# Patient Record
Sex: Female | Born: 1995 | Race: Black or African American | Hispanic: No | Marital: Single | Smoking: Former smoker
Health system: Southern US, Community
[De-identification: ages and names within clinical notes are randomized; demographics above are authoritative.]

## PROBLEM LIST (undated history)

## (undated) ENCOUNTER — Emergency Department (HOSPITAL_COMMUNITY): Admission: EM | Payer: BC Managed Care – PPO

## (undated) ENCOUNTER — Inpatient Hospital Stay (HOSPITAL_COMMUNITY): Payer: Self-pay

## (undated) DIAGNOSIS — R4020 Unspecified coma: Secondary | ICD-10-CM

## (undated) DIAGNOSIS — F329 Major depressive disorder, single episode, unspecified: Secondary | ICD-10-CM

## (undated) DIAGNOSIS — F32A Depression, unspecified: Secondary | ICD-10-CM

## (undated) DIAGNOSIS — G43909 Migraine, unspecified, not intractable, without status migrainosus: Secondary | ICD-10-CM

## (undated) DIAGNOSIS — H539 Unspecified visual disturbance: Secondary | ICD-10-CM

## (undated) DIAGNOSIS — A6000 Herpesviral infection of urogenital system, unspecified: Secondary | ICD-10-CM

## (undated) DIAGNOSIS — F419 Anxiety disorder, unspecified: Secondary | ICD-10-CM

## (undated) HISTORY — DX: Migraine, unspecified, not intractable, without status migrainosus: G43.909

## (undated) HISTORY — DX: Unspecified coma: R40.20

## (undated) HISTORY — PX: NO PAST SURGERIES: SHX2092

## (undated) HISTORY — PX: WISDOM TOOTH EXTRACTION: SHX21

## (undated) HISTORY — DX: Major depressive disorder, single episode, unspecified: F32.9

## (undated) HISTORY — DX: Depression, unspecified: F32.A

---
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
## 2016-02-15 ENCOUNTER — Encounter: Payer: Self-pay | Admitting: Nurse Practitioner

## 2016-02-15 ENCOUNTER — Ambulatory Visit (INDEPENDENT_AMBULATORY_CARE_PROVIDER_SITE_OTHER): Payer: BC Managed Care – PPO | Admitting: Nurse Practitioner

## 2016-02-15 VITALS — BP 106/68 | HR 80 | Ht 67.0 in | Wt 136.6 lb

## 2016-02-15 DIAGNOSIS — R55 Syncope and collapse: Secondary | ICD-10-CM | POA: Diagnosis not present

## 2016-02-15 DIAGNOSIS — G43709 Chronic migraine without aura, not intractable, without status migrainosus: Secondary | ICD-10-CM | POA: Diagnosis not present

## 2016-02-15 DIAGNOSIS — IMO0002 Reserved for concepts with insufficient information to code with codable children: Secondary | ICD-10-CM

## 2016-02-15 NOTE — Patient Instructions (Addendum)
EEG was normal She has stopped taking Topamax, difficulty to complying with the medications, Keep Maxalt as needed for headache Document any events May resume driving Follow up in 6 months
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
## 2016-08-18 ENCOUNTER — Ambulatory Visit: Payer: BC Managed Care – PPO | Admitting: Nurse Practitioner

## 2017-12-08 ENCOUNTER — Encounter (HOSPITAL_COMMUNITY): Payer: Self-pay | Admitting: Emergency Medicine

## 2017-12-08 ENCOUNTER — Emergency Department (HOSPITAL_COMMUNITY)
Admission: EM | Admit: 2017-12-08 | Discharge: 2017-12-08 | Disposition: A | Discharge status: Home / Self Care | Payer: BC Managed Care – PPO | Attending: Emergency Medicine | Admitting: Emergency Medicine

## 2017-12-08 DIAGNOSIS — S161XXA Strain of muscle, fascia and tendon at neck level, initial encounter: Secondary | ICD-10-CM

## 2017-12-08 DIAGNOSIS — M549 Dorsalgia, unspecified: Secondary | ICD-10-CM | POA: Insufficient documentation

## 2017-12-08 DIAGNOSIS — Z79899 Other long term (current) drug therapy: Secondary | ICD-10-CM | POA: Insufficient documentation

## 2017-12-08 DIAGNOSIS — Y999 Unspecified external cause status: Secondary | ICD-10-CM | POA: Insufficient documentation

## 2017-12-08 DIAGNOSIS — Y9389 Activity, other specified: Secondary | ICD-10-CM | POA: Diagnosis not present

## 2017-12-08 DIAGNOSIS — Y92410 Unspecified street and highway as the place of occurrence of the external cause: Secondary | ICD-10-CM | POA: Insufficient documentation

## 2017-12-08 DIAGNOSIS — M25511 Pain in right shoulder: Secondary | ICD-10-CM | POA: Diagnosis not present

## 2017-12-08 DIAGNOSIS — S199XXA Unspecified injury of neck, initial encounter: Secondary | ICD-10-CM | POA: Diagnosis present

## 2017-12-08 MED ORDER — METHOCARBAMOL 500 MG PO TABS: 500.0000 mg | ORAL_TABLET | Freq: Two times a day (BID) | ORAL | 0 refills | Status: DC

## 2017-12-08 MED ORDER — NAPROXEN 500 MG PO TABS: 500.0000 mg | ORAL_TABLET | Freq: Two times a day (BID) | ORAL | 0 refills | Status: DC
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
## 2017-12-08 NOTE — Discharge Instructions (Signed)
Take naproxen 2 times a day with meals.  Do not take other anti-inflammatories at the same time open (Advil, Motrin, ibuprofen, Aleve). You may supplement with Tylenol if you need further pain control. Use Flexeril as needed for muscle stiffness or soreness.  Have caution, this may make you tired or groggy.  Do not drive or operate heavy machinery while taking this medicine. Use ice packs or heating pads if this helps control your pain. You will likely have continued muscle stiffness and soreness over the next couple days.  Follow-up with primary care in 1 week if your symptoms are not improving. Return to the emergency room if you develop vision changes, vomiting, slurred speech, numbness, loss of bowel or bladder control, or any new or worsening symptoms.
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
## 2018-06-30 ENCOUNTER — Other Ambulatory Visit: Payer: Self-pay | Admitting: Family Medicine

## 2018-06-30 ENCOUNTER — Other Ambulatory Visit (HOSPITAL_COMMUNITY)
Admission: RE | Admit: 2018-06-30 | Discharge: 2018-06-30 | Disposition: A | Discharge status: Home / Self Care | Payer: BC Managed Care – PPO | Source: Ambulatory Visit | Attending: Family Medicine | Admitting: Family Medicine

## 2018-06-30 DIAGNOSIS — Z124 Encounter for screening for malignant neoplasm of cervix: Secondary | ICD-10-CM | POA: Insufficient documentation

## 2018-07-02 LAB — CYTOLOGY - PAP
CHLAMYDIA, DNA PROBE: NEGATIVE
Diagnosis: NEGATIVE
Neisseria Gonorrhea: NEGATIVE

## 2019-07-29 ENCOUNTER — Emergency Department (HOSPITAL_COMMUNITY)
Admission: EM | Admit: 2019-07-29 | Discharge: 2019-07-29 | Disposition: A | Discharge status: Home / Self Care | Payer: BC Managed Care – PPO | Attending: Emergency Medicine | Admitting: Emergency Medicine

## 2019-07-29 ENCOUNTER — Emergency Department (HOSPITAL_COMMUNITY): Payer: BC Managed Care – PPO

## 2019-07-29 ENCOUNTER — Other Ambulatory Visit: Payer: Self-pay

## 2019-07-29 DIAGNOSIS — S0012XA Contusion of left eyelid and periocular area, initial encounter: Secondary | ICD-10-CM | POA: Insufficient documentation

## 2019-07-29 DIAGNOSIS — Y93K1 Activity, walking an animal: Secondary | ICD-10-CM | POA: Diagnosis not present

## 2019-07-29 DIAGNOSIS — Z79899 Other long term (current) drug therapy: Secondary | ICD-10-CM | POA: Diagnosis not present

## 2019-07-29 DIAGNOSIS — S0181XA Laceration without foreign body of other part of head, initial encounter: Secondary | ICD-10-CM | POA: Diagnosis not present

## 2019-07-29 DIAGNOSIS — Y998 Other external cause status: Secondary | ICD-10-CM | POA: Diagnosis not present

## 2019-07-29 DIAGNOSIS — Y929 Unspecified place or not applicable: Secondary | ICD-10-CM | POA: Diagnosis not present

## 2019-07-29 DIAGNOSIS — S0990XA Unspecified injury of head, initial encounter: Secondary | ICD-10-CM | POA: Diagnosis present

## 2019-07-29 DIAGNOSIS — H05232 Hemorrhage of left orbit: Secondary | ICD-10-CM

## 2019-07-29 MED ORDER — OXYCODONE-ACETAMINOPHEN 5-325 MG PO TABS
1.0000 | ORAL_TABLET | Freq: Once | ORAL | Status: AC
  Administered 2019-07-29: 1 via ORAL
  Filled 2019-07-29: qty 1

## 2019-07-29 MED ORDER — LIDOCAINE-EPINEPHRINE (PF) 2 %-1:200000 IJ SOLN
10.0000 mL | Freq: Once | INTRAMUSCULAR | Status: AC
  Administered 2019-07-29: 10 mL
  Filled 2019-07-29: qty 20
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
## 2019-07-29 NOTE — ED Notes (Signed)
Patient transported to CT 

## 2019-07-29 NOTE — Discharge Instructions (Addendum)
Thank you for allowing me to care for you today in the Emergency Department.   Take 650 mg of Tylenol or 600 mg of ibuprofen with food every 6 hours for pain.  You can alternate between these 2 medications every 3 hours if your pain returns.  For instance, you can take Tylenol at noon, followed by a dose of ibuprofen at 3, followed by second dose of Tylenol and 6.  Apply ice pack for 15 to 20 minutes at least 4-5 times per day for the next 5 days.  This will help with pain and swelling of your left eye.  Your stitches should fall out on their own within the next week.  Keep the area clean and dry for the first 24 hours.  After that time, you can gently wash the area with warm water and soap.  You do not need to keep it covered unless you are concerned about the appearance.  You can apply a topical antibiotic ointment such as bacitracin or Neosporin.  If you feel unsafe at home, you can return to the ER for further evaluation.  You should also return if you develop severe pain to your left eye, visual changes in your left eye, if you pass out, develop persistent vomiting, or if your wound starts to get red, hot to the touch, swollen, or if you develop fever or chills.
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
## 2020-07-28 ENCOUNTER — Encounter (HOSPITAL_BASED_OUTPATIENT_CLINIC_OR_DEPARTMENT_OTHER): Payer: Self-pay | Admitting: Emergency Medicine

## 2020-07-28 ENCOUNTER — Emergency Department (HOSPITAL_BASED_OUTPATIENT_CLINIC_OR_DEPARTMENT_OTHER)
Admission: EM | Admit: 2020-07-28 | Discharge: 2020-07-28 | Disposition: A | Discharge status: Home / Self Care | Payer: BC Managed Care – PPO | Attending: Emergency Medicine | Admitting: Emergency Medicine

## 2020-07-28 ENCOUNTER — Other Ambulatory Visit: Payer: Self-pay

## 2020-07-28 ENCOUNTER — Emergency Department (HOSPITAL_BASED_OUTPATIENT_CLINIC_OR_DEPARTMENT_OTHER): Payer: BC Managed Care – PPO

## 2020-07-28 DIAGNOSIS — Z23 Encounter for immunization: Secondary | ICD-10-CM | POA: Diagnosis not present

## 2020-07-28 DIAGNOSIS — S61214A Laceration without foreign body of right ring finger without damage to nail, initial encounter: Secondary | ICD-10-CM | POA: Insufficient documentation

## 2020-07-28 DIAGNOSIS — W268XXA Contact with other sharp object(s), not elsewhere classified, initial encounter: Secondary | ICD-10-CM | POA: Diagnosis not present

## 2020-07-28 DIAGNOSIS — S61219A Laceration without foreign body of unspecified finger without damage to nail, initial encounter: Secondary | ICD-10-CM

## 2020-07-28 DIAGNOSIS — Y92512 Supermarket, store or market as the place of occurrence of the external cause: Secondary | ICD-10-CM | POA: Diagnosis not present

## 2020-07-28 DIAGNOSIS — S60949A Unspecified superficial injury of unspecified finger, initial encounter: Secondary | ICD-10-CM | POA: Diagnosis present

## 2020-07-28 MED ORDER — TETANUS-DIPHTH-ACELL PERTUSSIS 5-2.5-18.5 LF-MCG/0.5 IM SUSY
0.5000 mL | PREFILLED_SYRINGE | Freq: Once | INTRAMUSCULAR | Status: AC
  Administered 2020-07-28: 0.5 mL via INTRAMUSCULAR
  Filled 2020-07-28: qty 0.5

## 2020-07-28 MED ORDER — ACETAMINOPHEN 325 MG PO TABS
650.0000 mg | ORAL_TABLET | Freq: Once | ORAL | Status: AC
  Administered 2020-07-28: 650 mg via ORAL
  Filled 2020-07-28: qty 2

## 2020-07-28 MED ORDER — CEPHALEXIN 500 MG PO CAPS: 500.0000 mg | ORAL_CAPSULE | Freq: Four times a day (QID) | ORAL | 0 refills | Status: AC

## 2020-07-28 MED ORDER — LIDOCAINE HCL 2 % IJ SOLN
20.0000 mL | Freq: Once | INTRAMUSCULAR | Status: AC
  Administered 2020-07-28: 400 mg via INTRADERMAL
  Filled 2020-07-28: qty 20

## 2020-07-28 NOTE — ED Triage Notes (Signed)
She cut her R fingers on a shelf at the store. The ring finger has the most severe laceration. Bandage applied at triage.

## 2020-07-28 NOTE — Discharge Instructions (Addendum)
You were given a prescription for antibiotics. Please take the antibiotic prescription fully.   The sutures will need to be removed in 7-10 days.   Follow up with Dr. Merrilee Seashore office for reassessment of the sensation and strength in your finger.   Please return to the emergency room immediately if you experience any new or worsening symptoms or any symptoms that indicate worsening infection such as fevers, increased redness/swelling/pain, warmth, or drainage from the affected area.

## 2020-07-28 NOTE — ED Provider Notes (Signed)
[facility] EMERGENCY DEPARTMENT Provider Note   CSN: [id number] Arrival date & time: 07/28/20  1656     History Chief Complaint  Patient presents with  . Hand Injury    Elizabeth Becker is a 24 y.o. female.  HPI   24 y/o female with a h/o anxiety/depression, LOC, migraine, vision abnormalities, presents to the ED today for evaluation of lacerations to the fingers that occurred after cutting her fingers on metal prior to arrival.  Past Medical History:  Diagnosis Date  . Anxiety   . Depression   . Loss of consciousness (HCC)   . Migraine   . Vision abnormalities    wears glasses for distance    Patient Active Problem List   Diagnosis Date Noted  . Chronic migraine 07/10/2015  . Passed out 07/10/2015  . Conduct disorder, adolescent-onset type 10/07/2013  . 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 NO PAST SURGERIES       OB History   No obstetric history on file.     Family History  Problem Relation Age of Onset  . Healthy Mother   . Healthy Father     Social History   Tobacco Use  . Smoking status: Never Smoker  . Smokeless tobacco: Never Used  Substance Use Topics  . Alcohol use: No    Alcohol/week: 0.0 standard drinks  . Drug use: Yes    Comment: tried marijuana last use 1 year ago    Home Medications Prior to Admission medications   Medication Sig Start Date End Date Taking? Authorizing Provider  cephALEXin (KEFLEX) 500 MG capsule Take 1 capsule (500 mg total) by mouth 4 (four) times daily for 7 days. 07/28/20 08/04/20  Manveer Gomes S, PA-C  escitalopram (LEXAPRO) 20 MG tablet Take 1 tablet (20 mg total) by mouth daily after breakfast. 10/14/13   Winson, Louie Bun, NP  ibuprofen (ADVIL,MOTRIN) 400 MG tablet Take 1 tablet (400 mg total) by mouth every 6 (six) hours as needed for headache, mild pain or cramping. Patient may resume  home supply. 10/14/13   Winson, Louie Bun, NP  methocarbamol (ROBAXIN) 500 MG tablet Take 1 tablet (500 mg total) by mouth 2 (two) times daily. 12/08/17   Caccavale, Sophia, PA-C  naproxen (NAPROSYN) 500 MG tablet Take 1 tablet (500 mg total) by mouth 2 (two) times daily with a meal. 12/08/17   Caccavale, Sophia, PA-C  norethindrone-ethinyl estradiol (TRIPHASIL,CYCLAFEM,ALYACEN) 0.5/0.75/1-35 MG-MCG tablet Take 1 tablet by mouth daily. Patient/family can consider resumption of birth control with onset of next menses or as directed by outpatient prescribing provider.  If refill is needed, patient/family may contact outpatient prescribing provider for refill. 10/14/13   Winson, Louie Bun, NP  rizatriptan (MAXALT-MLT) 5 MG disintegrating tablet Take 1 tablet (5 mg total) by mouth as needed. May repeat in 2 hours if needed 07/10/15   Levert Feinstein, MD    Allergies    Patient has no known allergies.  Review of Systems   Review of Systems  Constitutional: Negative for fever.  Musculoskeletal:       Finger pain  Skin: Positive for wound.  Neurological: Positive for numbness. Negative for weakness.    Physical Exam Updated Vital Signs BP 101/72 (BP Location: Right Arm)   Pulse 73   Temp 97.6 F (36.4 C) (Oral)   Resp 16   Ht 5\' 7"  (1.702 m)   Wt  55.3 kg   LMP 06/25/2020   SpO2 100%   BMI 19.11 kg/m   Physical Exam Vitals and nursing note reviewed.  Constitutional:      General: She is not in acute distress.    Appearance: She is well-developed and well-nourished.  HENT:     Head: Normocephalic and atraumatic.  Eyes:     Conjunctiva/sclera: Conjunctivae normal.  Cardiovascular:     Rate and Rhythm: Normal rate.  Pulmonary:     Effort: Pulmonary effort is normal.  Musculoskeletal:        General: Normal range of motion.     Cervical back: Neck supple.     Comments: 3cm cervued lac to the 4th digit on the right hand over the middle phalanx. Brisk cap refill noted distally. Decreased sensation  noted distal to the wound. Strength appears intact with flexion/extension at the DIP, PIP and MCP joint. More superficial abrasion noted the right middle finger that is not actively bleeding. Strength, sensation, rom and perfusion intact to this finger.   Skin:    General: Skin is warm and dry.  Neurological:     Mental Status: She is alert.     Sensory: Sensory deficit:    Psychiatric:        Mood and Affect: Mood and affect normal.     ED Results / Procedures / Treatments   Labs (all labs ordered are listed, but only abnormal results are displayed) Labs Reviewed - No data to display  EKG None  Radiology DG Hand Complete Right  Result Date: 07/28/2020 CLINICAL DATA:  Laceration EXAM: RIGHT HAND - COMPLETE 3+ VIEW COMPARISON:  None. FINDINGS: Frontal, oblique, lateral views of the right hand are obtained. There is significant soft tissue swelling volar aspect fourth digit at the level of the middle phalanx. No radiopaque foreign body. No acute fracture, subluxation, or dislocation. Joint spaces are well preserved. IMPRESSION: 1. Soft tissue swelling fourth digit. No fracture or radiopaque foreign body. Electronically Signed   By: Sharlet Salina M.D.   On: 07/28/2020 19:32    Procedures .Marland KitchenLaceration Repair  Date/Time: 07/28/2020 9:18 PM Performed by: Karrie Meres, PA-C Authorized by: Karrie Meres, PA-C   Consent:    Consent obtained:  Verbal   Consent given by:  Patient   Risks, benefits, and alternatives were discussed: yes     Risks discussed:  Infection, pain, need for additional repair and poor cosmetic result   Alternatives discussed:  No treatment Universal protocol:    Procedure explained and questions answered to patient or proxy's satisfaction: yes     Imaging studies available: yes     Site/side marked: yes     Patient identity confirmed:  Verbally with patient Anesthesia:    Anesthesia method:  Nerve block   Block needle gauge:  25 G   Block  anesthetic:  Lidocaine 2% w/o epi   Block injection procedure:  Anatomic landmarks identified, introduced needle, incremental injection, negative aspiration for blood and anatomic landmarks palpated   Block outcome:  Anesthesia achieved Laceration details:    Location:  Finger   Finger location:  R ring finger   Length (cm):  3 Pre-procedure details:    Preparation:  Patient was prepped and draped in usual sterile fashion and imaging obtained to evaluate for foreign bodies Exploration:    Limited defect created (wound extended): no     Hemostasis achieved with:  Direct pressure and tourniquet   Wound exploration: wound explored through full range  of motion and entire depth of wound visualized     Contaminated: no   Treatment:    Area cleansed with:  Saline   Amount of cleaning:  Extensive   Irrigation solution:  Sterile saline   Irrigation volume:  1L   Irrigation method:  Pressure wash   Visualized foreign bodies/material removed: no     Debridement:  None   Undermining:  None   Scar revision: no   Skin repair:    Repair method:  Sutures   Suture size:  5-0   Suture material:  Prolene   Suture technique:  Simple interrupted   Number of sutures:  9 Approximation:    Approximation:  Close Repair type:    Repair type:  Simple Post-procedure details:    Dressing:  Splint for protection and non-adherent dressing   Procedure completion:  Tolerated   (including critical care time)  Medications Ordered in ED Medications  Tdap (BOOSTRIX) injection 0.5 mL (0.5 mLs Intramuscular Given 07/28/20 1922)  lidocaine (XYLOCAINE) 2 % (with pres) injection 400 mg (400 mg Intradermal Given by Other 07/28/20 1925)  acetaminophen (TYLENOL) tablet 650 mg (650 mg Oral Given 07/28/20 1921)    ED Course  I have reviewed the triage vital signs and the nursing notes.  Pertinent labs & imaging results that were available during my care of the patient were reviewed by me and considered in my  medical decision making (see chart for details).    MDM Rules/Calculators/A&P                          Pressure irrigation performed. Wound explored and base of wound visualized in a bloodless field without evidence of foreign body.  Laceration occurred < 8 hours prior to repair which was well tolerated.  Tdap updated.  Pt has  no comorbidities to effect normal wound healing. Pt discharged with antibiotics. No obvious flexor tendon involvement on exam but does have some decreased sensation to the finger. Will have pt f/u with hand surgery. Discussed suture home care with patient and answered questions. Pt to follow-up for wound check and suture removal in 7-10 days; they are to return to the ED sooner for signs of infection. Pt is hemodynamically stable with no complaints prior to dc.    Final Clinical Impression(s) / ED Diagnoses Final diagnoses:  Laceration of finger of right hand without foreign body without damage to nail, unspecified finger, initial encounter    Rx / DC Orders ED Discharge Orders         Ordered    cephALEXin (KEFLEX) 500 MG capsule  4 times daily        07/28/20 2117           Rayne Du 07/28/20 2119    Rolan Bucco, MD 07/28/20 2311

## 2020-07-28 NOTE — ED Notes (Signed)
Pt to Xray.

## 2020-08-01 ENCOUNTER — Other Ambulatory Visit: Payer: Self-pay | Admitting: Orthopedic Surgery

## 2020-08-02 ENCOUNTER — Other Ambulatory Visit: Payer: Self-pay

## 2020-08-02 ENCOUNTER — Encounter (HOSPITAL_BASED_OUTPATIENT_CLINIC_OR_DEPARTMENT_OTHER): Payer: Self-pay | Admitting: Orthopedic Surgery

## 2020-08-06 ENCOUNTER — Other Ambulatory Visit (HOSPITAL_COMMUNITY)
Admission: RE | Admit: 2020-08-06 | Discharge: 2020-08-06 | Disposition: A | Discharge status: Home / Self Care | Payer: BC Managed Care – PPO | Source: Ambulatory Visit | Attending: Orthopedic Surgery | Admitting: Orthopedic Surgery

## 2020-08-06 DIAGNOSIS — Z79899 Other long term (current) drug therapy: Secondary | ICD-10-CM | POA: Diagnosis not present

## 2020-08-06 DIAGNOSIS — Z01812 Encounter for preprocedural laboratory examination: Secondary | ICD-10-CM | POA: Insufficient documentation

## 2020-08-06 DIAGNOSIS — X58XXXA Exposure to other specified factors, initial encounter: Secondary | ICD-10-CM | POA: Diagnosis not present

## 2020-08-06 DIAGNOSIS — S61214A Laceration without foreign body of right ring finger without damage to nail, initial encounter: Secondary | ICD-10-CM | POA: Diagnosis present

## 2020-08-06 DIAGNOSIS — Z20822 Contact with and (suspected) exposure to covid-19: Secondary | ICD-10-CM | POA: Insufficient documentation

## 2020-08-06 LAB — SARS CORONAVIRUS 2 (TAT 6-24 HRS): SARS Coronavirus 2: NEGATIVE

## 2020-08-06 NOTE — Anesthesia Preprocedure Evaluation (Addendum)
Anesthesia Evaluation  Patient identified by MRN, date of birth, ID band Patient awake    Reviewed: Allergy & Precautions, NPO status , Patient's Chart, lab work & pertinent test results  Airway Mallampati: II  TM Distance: >3 FB Neck ROM: Full    Dental no notable dental hx. (+) Teeth Intact, Dental Advisory Given   Pulmonary neg pulmonary ROS,    Pulmonary exam normal breath sounds clear to auscultation       Cardiovascular Exercise Tolerance: Good negative cardio ROS Normal cardiovascular exam Rhythm:Regular Rate:Normal     Neuro/Psych  Headaches, Anxiety    GI/Hepatic negative GI ROS, Neg liver ROS,   Endo/Other    Renal/GU negative Renal ROS     Musculoskeletal   Abdominal   Peds  Hematology negative hematology ROS (+)   Anesthesia Other Findings   Reproductive/Obstetrics                            Anesthesia Physical Anesthesia Plan  ASA: I  Anesthesia Plan: General   Post-op Pain Management:    Induction:   PONV Risk Score and Plan: Treatment may vary due to age or medical condition, Midazolam and Ondansetron  Airway Management Planned: LMA  Additional Equipment: None  Intra-op Plan:   Post-operative Plan:   Informed Consent: I have reviewed the patients History and Physical, chart, labs and discussed the procedure including the risks, benefits and alternatives for the proposed anesthesia with the patient or authorized representative who has indicated his/her understanding and acceptance.     Dental advisory given  Plan Discussed with: CRNA and Anesthesiologist  Anesthesia Plan Comments:       Anesthesia Quick Evaluation

## 2020-08-07 ENCOUNTER — Ambulatory Visit (HOSPITAL_BASED_OUTPATIENT_CLINIC_OR_DEPARTMENT_OTHER): Payer: BC Managed Care – PPO | Admitting: Anesthesiology

## 2020-08-07 ENCOUNTER — Encounter (HOSPITAL_BASED_OUTPATIENT_CLINIC_OR_DEPARTMENT_OTHER): Payer: Self-pay | Admitting: Orthopedic Surgery

## 2020-08-07 ENCOUNTER — Encounter (HOSPITAL_BASED_OUTPATIENT_CLINIC_OR_DEPARTMENT_OTHER)
Admission: RE | Disposition: A | Discharge status: Home / Self Care | Payer: Self-pay | Source: Home / Self Care | Attending: Orthopedic Surgery

## 2020-08-07 ENCOUNTER — Ambulatory Visit (HOSPITAL_BASED_OUTPATIENT_CLINIC_OR_DEPARTMENT_OTHER)
Admission: RE | Admit: 2020-08-07 | Discharge: 2020-08-07 | Disposition: A | Discharge status: Home / Self Care | Payer: BC Managed Care – PPO | Attending: Orthopedic Surgery | Admitting: Orthopedic Surgery

## 2020-08-07 ENCOUNTER — Other Ambulatory Visit: Payer: Self-pay

## 2020-08-07 DIAGNOSIS — S61214A Laceration without foreign body of right ring finger without damage to nail, initial encounter: Secondary | ICD-10-CM | POA: Diagnosis not present

## 2020-08-07 DIAGNOSIS — X58XXXA Exposure to other specified factors, initial encounter: Secondary | ICD-10-CM | POA: Insufficient documentation

## 2020-08-07 DIAGNOSIS — Z20822 Contact with and (suspected) exposure to covid-19: Secondary | ICD-10-CM | POA: Insufficient documentation

## 2020-08-07 DIAGNOSIS — Z79899 Other long term (current) drug therapy: Secondary | ICD-10-CM | POA: Insufficient documentation

## 2020-08-07 HISTORY — PX: WOUND EXPLORATION: SHX6188

## 2020-08-07 LAB — POCT PREGNANCY, URINE: Preg Test, Ur: NEGATIVE

## 2020-08-07 SURGERY — WOUND EXPLORATION
Anesthesia: Regional | Site: Finger | Laterality: Right

## 2020-08-07 MED ORDER — ONDANSETRON HCL 4 MG/2ML IJ SOLN
INTRAMUSCULAR | Status: DC | PRN
  Administered 2020-08-07: 4 mg via INTRAVENOUS

## 2020-08-07 MED ORDER — OXYCODONE HCL 5 MG PO TABS
ORAL_TABLET | ORAL | Status: AC
  Filled 2020-08-07: qty 1

## 2020-08-07 MED ORDER — ACETAMINOPHEN 160 MG/5ML PO SOLN
325.0000 mg | ORAL | Status: DC | PRN
Start: 2020-08-07 — End: 2020-08-07

## 2020-08-07 MED ORDER — LIDOCAINE HCL (CARDIAC) PF 100 MG/5ML IV SOSY
PREFILLED_SYRINGE | INTRAVENOUS | Status: DC | PRN
  Administered 2020-08-07: 100 mg via INTRAVENOUS

## 2020-08-07 MED ORDER — FENTANYL CITRATE (PF) 100 MCG/2ML IJ SOLN
INTRAMUSCULAR | Status: DC | PRN
  Administered 2020-08-07: 50 ug via INTRAVENOUS
  Administered 2020-08-07 (×2): 25 ug via INTRAVENOUS

## 2020-08-07 MED ORDER — MIDAZOLAM HCL 5 MG/5ML IJ SOLN
INTRAMUSCULAR | Status: DC | PRN
  Administered 2020-08-07: 2 mg via INTRAVENOUS

## 2020-08-07 MED ORDER — FENTANYL CITRATE (PF) 100 MCG/2ML IJ SOLN
INTRAMUSCULAR | Status: AC
  Filled 2020-08-07: qty 2

## 2020-08-07 MED ORDER — ACETAMINOPHEN 325 MG PO TABS: 325.0000 mg | ORAL_TABLET | ORAL | Status: DC | PRN

## 2020-08-07 MED ORDER — OXYCODONE HCL 5 MG/5ML PO SOLN: 5.0000 mg | Freq: Once | ORAL | Status: AC | PRN

## 2020-08-07 MED ORDER — HYDROCODONE-ACETAMINOPHEN 5-325 MG PO TABS: ORAL_TABLET | ORAL | 0 refills | Status: DC

## 2020-08-07 MED ORDER — DEXAMETHASONE SODIUM PHOSPHATE 10 MG/ML IJ SOLN
INTRAMUSCULAR | Status: AC
  Filled 2020-08-07: qty 1

## 2020-08-07 MED ORDER — LACTATED RINGERS IV SOLN: INTRAVENOUS | Status: DC

## 2020-08-07 MED ORDER — MIDAZOLAM HCL 2 MG/2ML IJ SOLN
INTRAMUSCULAR | Status: AC
  Filled 2020-08-07: qty 2

## 2020-08-07 MED ORDER — DEXAMETHASONE SODIUM PHOSPHATE 10 MG/ML IJ SOLN
INTRAMUSCULAR | Status: DC | PRN
  Administered 2020-08-07: 10 mg via INTRAVENOUS

## 2020-08-07 MED ORDER — CEFAZOLIN SODIUM-DEXTROSE 2-4 GM/100ML-% IV SOLN
2.0000 g | INTRAVENOUS | Status: AC
  Administered 2020-08-07: 14:00:00 2 g via INTRAVENOUS

## 2020-08-07 MED ORDER — LIDOCAINE 2% (20 MG/ML) 5 ML SYRINGE
INTRAMUSCULAR | Status: AC
  Filled 2020-08-07: qty 5

## 2020-08-07 MED ORDER — CEFAZOLIN SODIUM-DEXTROSE 2-4 GM/100ML-% IV SOLN
INTRAVENOUS | Status: AC
  Filled 2020-08-07: qty 100

## 2020-08-07 MED ORDER — BUPIVACAINE HCL (PF) 0.25 % IJ SOLN
INTRAMUSCULAR | Status: DC | PRN
  Administered 2020-08-07: 9 mL

## 2020-08-07 MED ORDER — HEPARIN SODIUM (PORCINE) 1000 UNIT/ML IJ SOLN
INTRAMUSCULAR | Status: AC
  Filled 2020-08-07: qty 1

## 2020-08-07 MED ORDER — ONDANSETRON HCL 4 MG/2ML IJ SOLN: 4.0000 mg | Freq: Once | INTRAMUSCULAR | Status: DC | PRN

## 2020-08-07 MED ORDER — LIDOCAINE HCL (PF) 1 % IJ SOLN
INTRAMUSCULAR | Status: AC
  Filled 2020-08-07: qty 30

## 2020-08-07 MED ORDER — PROPOFOL 10 MG/ML IV BOLUS
INTRAVENOUS | Status: DC | PRN
  Administered 2020-08-07: 150 mg via INTRAVENOUS

## 2020-08-07 MED ORDER — OXYCODONE HCL 5 MG PO TABS
5.0000 mg | ORAL_TABLET | Freq: Once | ORAL | Status: AC | PRN
  Administered 2020-08-07: 5 mg via ORAL

## 2020-08-07 MED ORDER — FENTANYL CITRATE (PF) 100 MCG/2ML IJ SOLN
25.0000 ug | INTRAMUSCULAR | Status: DC | PRN
Start: 2020-08-07 — End: 2020-08-07

## 2020-08-07 SURGICAL SUPPLY — 69 items
APL PRP STRL LF DISP 70% ISPRP (MISCELLANEOUS)
BAG DECANTER FOR FLEXI CONT (MISCELLANEOUS) IMPLANT
BLADE MINI RND TIP GREEN BEAV (BLADE) IMPLANT
BLADE SURG 15 STRL LF DISP TIS (BLADE) ×4 IMPLANT
BLADE SURG 15 STRL SS (BLADE) ×6
BNDG CMPR 9X4 STRL LF SNTH (GAUZE/BANDAGES/DRESSINGS) ×2
BNDG COHESIVE 1X5 TAN STRL LF (GAUZE/BANDAGES/DRESSINGS) ×2 IMPLANT
BNDG ELASTIC 3X5.8 VLCR STR LF (GAUZE/BANDAGES/DRESSINGS) ×1 IMPLANT
BNDG ESMARK 4X9 LF (GAUZE/BANDAGES/DRESSINGS) ×2 IMPLANT
BNDG GAUZE ELAST 4 BULKY (GAUZE/BANDAGES/DRESSINGS) ×1 IMPLANT
BRUSH SCRUB EZ PLAIN DRY (MISCELLANEOUS) ×3 IMPLANT
CATH ROBINSON RED A/P 10FR (CATHETERS) IMPLANT
CHLORAPREP W/TINT 26 (MISCELLANEOUS) ×1 IMPLANT
CORD BIPOLAR FORCEPS 12FT (ELECTRODE) ×3 IMPLANT
COVER BACK TABLE 60X90IN (DRAPES) ×3 IMPLANT
COVER MAYO STAND STRL (DRAPES) ×3 IMPLANT
COVER WAND RF STERILE (DRAPES) IMPLANT
CUFF TOURN SGL QUICK 18X4 (TOURNIQUET CUFF) ×2 IMPLANT
DECANTER SPIKE VIAL GLASS SM (MISCELLANEOUS) ×1 IMPLANT
DRAPE EXTREMITY T 121X128X90 (DISPOSABLE) ×3 IMPLANT
DRAPE SURG 17X23 STRL (DRAPES) ×3 IMPLANT
GAUZE SPONGE 4X4 12PLY STRL (GAUZE/BANDAGES/DRESSINGS) ×3 IMPLANT
GAUZE XEROFORM 1X8 LF (GAUZE/BANDAGES/DRESSINGS) ×3 IMPLANT
GLOVE BIO SURGEON STRL SZ7 (GLOVE) ×2 IMPLANT
GLOVE BIO SURGEON STRL SZ7.5 (GLOVE) ×3 IMPLANT
GLOVE BIOGEL PI IND STRL 7.5 (GLOVE) ×1 IMPLANT
GLOVE BIOGEL PI IND STRL 8.5 (GLOVE) IMPLANT
GLOVE BIOGEL PI INDICATOR 7.5 (GLOVE) ×1
GLOVE BIOGEL PI INDICATOR 8.5 (GLOVE)
GLOVE SRG 8 PF TXTR STRL LF DI (GLOVE) ×2 IMPLANT
GLOVE SURG ORTHO 8.0 STRL STRW (GLOVE) ×2 IMPLANT
GLOVE SURG UNDER POLY LF SZ6.5 (GLOVE) ×2 IMPLANT
GLOVE SURG UNDER POLY LF SZ8 (GLOVE) ×3
GOWN STRL REUS W/ TWL LRG LVL3 (GOWN DISPOSABLE) ×3 IMPLANT
GOWN STRL REUS W/TWL LRG LVL3 (GOWN DISPOSABLE) ×6
GOWN STRL REUS W/TWL XL LVL3 (GOWN DISPOSABLE) ×5 IMPLANT
LOOP VESSEL MAXI BLUE (MISCELLANEOUS) IMPLANT
NDL HYPO 25X1 1.5 SAFETY (NEEDLE) IMPLANT
NDL SAFETY ECLIPSE 18X1.5 (NEEDLE) IMPLANT
NEEDLE HYPO 18GX1.5 SHARP (NEEDLE)
NEEDLE HYPO 25X1 1.5 SAFETY (NEEDLE) ×3 IMPLANT
NS IRRIG 1000ML POUR BTL (IV SOLUTION) ×3 IMPLANT
PACK BASIN DAY SURGERY FS (CUSTOM PROCEDURE TRAY) ×3 IMPLANT
PAD CAST 3X4 CTTN HI CHSV (CAST SUPPLIES) ×1 IMPLANT
PAD CAST 4YDX4 CTTN HI CHSV (CAST SUPPLIES) IMPLANT
PADDING CAST ABS 4INX4YD NS (CAST SUPPLIES)
PADDING CAST ABS COTTON 4X4 ST (CAST SUPPLIES) ×1 IMPLANT
PADDING CAST COTTON 3X4 STRL (CAST SUPPLIES)
PADDING CAST COTTON 4X4 STRL (CAST SUPPLIES)
SLEEVE SCD COMPRESS KNEE MED (MISCELLANEOUS) ×2 IMPLANT
SPEAR EYE SURG WECK-CEL (MISCELLANEOUS) ×3 IMPLANT
SPLINT FINGER 3.25 911903 (SOFTGOODS) ×2 IMPLANT
SPLINT PLASTER CAST XFAST 3X15 (CAST SUPPLIES) IMPLANT
SPLINT PLASTER XTRA FASTSET 3X (CAST SUPPLIES)
STOCKINETTE 4X48 STRL (DRAPES) ×3 IMPLANT
SUT ETHIBOND 3-0 V-5 (SUTURE) IMPLANT
SUT ETHILON 4 0 PS 2 18 (SUTURE) ×3 IMPLANT
SUT FIBERWIRE 4-0 18 TAPR NDL (SUTURE)
SUT NYLON 9 0 VRM6 (SUTURE) IMPLANT
SUT PROLENE 6 0 P 1 18 (SUTURE) IMPLANT
SUT SILK 4 0 PS 2 (SUTURE) IMPLANT
SUT SUPRAMID 4-0 (SUTURE) IMPLANT
SUT VICRYL 4-0 PS2 18IN ABS (SUTURE) IMPLANT
SUTURE FIBERWR 4-0 18 TAPR NDL (SUTURE) IMPLANT
SYR BULB EAR ULCER 3OZ GRN STR (SYRINGE) ×3 IMPLANT
SYR CONTROL 10ML LL (SYRINGE) ×2 IMPLANT
TOWEL GREEN STERILE FF (TOWEL DISPOSABLE) ×6 IMPLANT
TRAY DSU PREP LF (CUSTOM PROCEDURE TRAY) ×2 IMPLANT
UNDERPAD 30X36 HEAVY ABSORB (UNDERPADS AND DIAPERS) ×3 IMPLANT

## 2020-08-07 NOTE — Anesthesia Procedure Notes (Signed)
Procedure Name: LMA Insertion Date/Time: 08/07/2020 2:25 PM Performed by: Lauralyn Primes, CRNA Pre-anesthesia Checklist: Patient identified, Emergency Drugs available, Suction available and Patient being monitored Patient Re-evaluated:Patient Re-evaluated prior to induction Oxygen Delivery Method: Circle system utilized Preoxygenation: Pre-oxygenation with 100% oxygen Induction Type: IV induction Ventilation: Mask ventilation without difficulty LMA: LMA inserted LMA Size: 4.0 Number of attempts: 1 Airway Equipment and Method: Bite block Placement Confirmation: positive ETCO2 Tube secured with: Tape Dental Injury: Teeth and Oropharynx as per pre-operative assessment

## 2020-08-07 NOTE — Discharge Instructions (Signed)

## 2020-08-07 NOTE — Op Note (Signed)
NAME: Elizabeth Becker MEDICAL RECORD NO: [id number] DATE OF BIRTH: [birth year] FACILITY: [facility] LOCATION: [facility] PHYSICIAN: Tami Ribas, MD   OPERATIVE REPORT   DATE OF PROCEDURE: 08/07/20    PREOPERATIVE DIAGNOSIS:   Right ring finger laceration with possible tendon artery nerve laceration   POSTOPERATIVE DIAGNOSIS:   Right ring finger laceration   PROCEDURE:   Exploration right ring finger laceration approximately 3.5 cm   SURGEON:  Betha Loa, M.D.   ASSISTANT: Cindee Salt, MD   ANESTHESIA:  General   INTRAVENOUS FLUIDS:  Per anesthesia flow sheet.   ESTIMATED BLOOD LOSS:  Minimal.   COMPLICATIONS:  None.   SPECIMENS:  none   TOURNIQUET TIME:    Total Tourniquet Time Documented: Upper Arm (Right) - 23 minutes Total: Upper Arm (Right) - 23 minutes    DISPOSITION:  Stable to PACU.   INDICATIONS: 24 year old female states she sustained laceration to the right ring finger 07/28/2020.  She was seen at the emergency department where the wound was cleaned and sutured.  She followed up in the office.  She had weak flexion of the DIP joint and decreased sensation the fingertip.  She wishes to proceed with operative exploration with repair of tendon artery nerve is necessary.  Risks, benefits and alternatives of surgery were discussed including the risks of blood loss, infection, damage to nerves, vessels, tendons, ligaments, bone for surgery, need for additional surgery, complications with wound healing, continued pain, stiffness.  She voiced understanding of these risks and elected to proceed.  OPERATIVE COURSE:  After being identified preoperatively by myself,  the patient and I agreed on the procedure and site of the procedure.  The surgical site was marked.  Surgical consent had been signed. She was given IV antibiotics as preoperative antibiotic prophylaxis. She was transferred to the operating room and placed on the operating table in supine position  with the Right upper extremity on an arm board.  General anesthesia was induced by the anesthesiologist.  Right upper extremity was prepped and draped in normal sterile orthopedic fashion.  A surgical pause was performed between the surgeons, anesthesia, and operating room staff and all were in agreement as to the patient, procedure, and site of procedure.  Tourniquet at the proximal aspect of the extremity was inflated to 250 mmHg after exsanguination of the arm with an Esmarch bandage.  The wound was opened after removal of the sutures.  The flexor sheath was identified and was intact.  There is no blood within the sheath.  The finger is placed to range of motion and no damage to the tendons was noted.  The ulnar digital nerve was identified and was intact.  The radial digital nerve was identified and traced through the extent of the wound and was intact.  The wound did not go deep to the nerves.  The skin edges were sharply debrided of any devitalized skin using the scissors.  At the proximal edge of the flap there was some skin that had necrosis.  This was removed.  The wound was copiously irrigated with sterile saline.  It was then repaired with 4-0 nylon suture in a horizontal mattress fashion.  At the proximal edge there was a small area that could not be brought back together of approximately 5 to 7 mm.  There was good subcutaneous tissue within.  This was left to granulate.  The wound was dressed with sterile Xeroform and 4 x 4 and wrapped with a Coban dressing  lightly.  An AlumaFoam splint was placed and wrapped lightly with Coban dressing.  A digital block was performed with quarter percent plain Marcaine to aid in postoperative analgesia.  The tourniquet was deflated at 23 minutes.  Fingertips were pink with brisk capillary refill after deflation of tourniquet.  The operative  drapes were broken down.  The patient was awoken from anesthesia safely.  She was transferred back to the stretcher and taken to  PACU in stable condition.  I will see her back in the office in 1 week for postoperative followup.  I will give her a prescription for Norco 5/325 1-2 tabs PO q6 hours prn pain, dispense #15.   Betha Loa, MD Electronically signed, 08/07/20

## 2020-08-07 NOTE — H&P (Signed)
Elizabeth Becker is an 24 y.o. female.   Chief Complaint: finger laceration HPI: 24 yo female states she sustained laceration to right ring finger 07/28/20.  Seen at ED where wound cleaned and sutured.  Followed up in office.  She has weak flexion of dip joint and decreased sensation.  She wishes to have exploration with repair tendon/artery/nerve as necessary.  Allergies: No Known Allergies  Past Medical History:  Diagnosis Date  . Anxiety   . Depression   . Loss of consciousness (HCC)   . Migraine   . Vision abnormalities    wears glasses for distance    Past Surgical History:  Procedure Laterality Date  . NO PAST SURGERIES    . WISDOM TOOTH EXTRACTION      Family History: Family History  Problem Relation Age of Onset  . Healthy Mother   . Healthy Father     Social History:   reports that she has never smoked. She has never used smokeless tobacco. She reports previous drug use. She reports that she does not drink alcohol.  Medications: Medications Prior to Admission  Medication Sig Dispense Refill  . ibuprofen (ADVIL,MOTRIN) 400 MG tablet Take 1 tablet (400 mg total) by mouth every 6 (six) hours as needed for headache, mild pain or cramping. Patient may resume home supply.    . norethindrone-ethinyl estradiol (TRIPHASIL,CYCLAFEM,ALYACEN) 0.5/0.75/1-35 MG-MCG tablet Take 1 tablet by mouth daily. Patient/family can consider resumption of birth control with onset of next menses or as directed by outpatient prescribing provider.  If refill is needed, patient/family may contact outpatient prescribing provider for refill.    . escitalopram (LEXAPRO) 20 MG tablet Take 1 tablet (20 mg total) by mouth daily after breakfast. 30 tablet 1  . rizatriptan (MAXALT-MLT) 5 MG disintegrating tablet Take 1 tablet (5 mg total) by mouth as needed. May repeat in 2 hours if needed 15 tablet 6    Results for orders placed or performed during the hospital encounter of 08/06/20 (from the past 48  hour(s))  SARS CORONAVIRUS 2 (TAT 6-24 HRS) Nasopharyngeal Nasopharyngeal Swab     Status: None   Collection Time: 08/06/20 11:29 AM   Specimen: Nasopharyngeal Swab  Result Value Ref Range   SARS Coronavirus 2 NEGATIVE NEGATIVE    Comment: (NOTE) SARS-CoV-2 target nucleic acids are NOT DETECTED.  The SARS-CoV-2 RNA is generally detectable in upper and lower respiratory specimens during the acute phase of infection. Negative results do not preclude SARS-CoV-2 infection, do not rule out co-infections with other pathogens, and should not be used as the sole basis for treatment or other patient management decisions. Negative results must be combined with clinical observations, patient history, and epidemiological information. The expected result is Negative.  Fact Sheet for Patients: HairSlick.no  Fact Sheet for Healthcare Providers: quierodirigir.com  This test is not yet approved or cleared by the [place] FDA and  has been authorized for detection and/or diagnosis of SARS-CoV-2 by FDA under an Emergency Use Authorization (EUA). This EUA will remain  in effect (meaning this test can be used) for the duration of the COVID-19 declaration under Se ction 564(b)(1) of the Act, 21 U.S.C. section 360bbb-3(b)(1), unless the authorization is terminated or revoked sooner.  Performed at [facility], [address] [place], [place] [address]     No results found.   A comprehensive review of systems was negative.  Height 5\' 7"  (1.702 m), weight 54.4 kg, last menstrual period 06/17/2020.  General appearance: alert, cooperative  and appears stated age Head: Normocephalic, without obvious abnormality, atraumatic Neck: supple, symmetrical, trachea midline Cardio: regular rate and rhythm Resp: clear to auscultation bilaterally Extremities: Intact sensation and capillary refill all digits.  +epl/fpl/io.  Weak flexion right  ring dip joint.  Decreased sensation on radial side of finger.  Laceration at pip joint and coursing along radial side of finger. Pulses: 2+ and symmetric Skin: Skin color, texture, turgor normal. No rashes or lesions Neurologic: Grossly normal Incision/Wound: as above  Assessment/Plan Right ring finger laceration.  Plan exploration with repair tendon/artery/nerve as necessary.  Non operative and operative treatment options have been discussed with the patient and patient wishes to proceed with operative treatment. Risks, benefits, and alternatives of surgery have been discussed and the patient agrees with the plan of care.   Betha Loa 08/07/2020, 12:20 PM

## 2020-08-07 NOTE — Op Note (Signed)
I assisted Surgeon(s) and Role:    * Betha Loa, MD - Primary    Cindee Salt, MD on the Procedure(s): EXPLORATION AND REPAIR TENDON, ARTERY AND NERVES RIGHT RING FINGER on 08/07/2020.  I provided assistance on this case as follows: setup, approach, identification of the radial and ulnar digital nerves, closure of the wound and application of the dressing and splint.  Electronically signed by: Cindee Salt, MD Date: 08/07/2020 Time: 3:08 PM

## 2020-08-07 NOTE — Transfer of Care (Signed)
Immediate Anesthesia Transfer of Care Note  Patient: Elizabeth Becker  Procedure(s) Performed: EXPLORATION AND REPAIR TENDON, ARTERY AND NERVES RIGHT RING FINGER (Right Finger)  Patient Location: PACU  Anesthesia Type:General  Level of Consciousness: awake, alert  and oriented  Airway & Oxygen Therapy: Patient Spontanous Breathing and Patient connected to face mask oxygen  Post-op Assessment: Report given to RN and Post -op Vital signs reviewed and stable  Post vital signs: Reviewed and stable  Last Vitals:  Vitals Value Taken Time  BP    Temp    Pulse    Resp    SpO2      Last Pain:  Vitals:   08/07/20 1220  TempSrc: Oral  PainSc: 0-No pain         Complications: No complications documented.

## 2020-08-08 ENCOUNTER — Encounter (HOSPITAL_BASED_OUTPATIENT_CLINIC_OR_DEPARTMENT_OTHER): Payer: Self-pay | Admitting: Orthopedic Surgery

## 2020-08-08 NOTE — Anesthesia Postprocedure Evaluation (Signed)
Anesthesia Post Note  Patient: Elizabeth Becker  Procedure(s) Performed: EXPLORATION AND REPAIR TENDON, ARTERY AND NERVES RIGHT RING FINGER (Right Finger)     Patient location during evaluation: PACU Anesthesia Type: General Level of consciousness: awake and alert Pain management: pain level controlled Vital Signs Assessment: post-procedure vital signs reviewed and stable Respiratory status: spontaneous breathing, nonlabored ventilation, respiratory function stable and patient connected to nasal cannula oxygen Cardiovascular status: blood pressure returned to baseline and stable Postop Assessment: no apparent nausea or vomiting Anesthetic complications: no   No complications documented.  Last Vitals:  Vitals:   08/07/20 1527 08/07/20 1540  BP: 130/87 131/88  Pulse: 77 77  Resp: 12 16  Temp:  (!) 36.3 C  SpO2: 100% 100%    Last Pain:  Vitals:   08/07/20 1527  TempSrc:   PainSc: 4                  Trevor Iha

## 2020-08-14 ENCOUNTER — Encounter (HOSPITAL_BASED_OUTPATIENT_CLINIC_OR_DEPARTMENT_OTHER): Payer: Self-pay | Admitting: Orthopedic Surgery

## 2020-10-06 ENCOUNTER — Ambulatory Visit (HOSPITAL_COMMUNITY)
Admission: AD | Admit: 2020-10-06 | Discharge: 2020-10-06 | Disposition: A | Discharge status: Home / Self Care | Payer: BC Managed Care – PPO | Attending: Psychiatry | Admitting: Psychiatry

## 2020-10-06 DIAGNOSIS — F419 Anxiety disorder, unspecified: Secondary | ICD-10-CM | POA: Insufficient documentation

## 2020-10-06 DIAGNOSIS — R45851 Suicidal ideations: Secondary | ICD-10-CM | POA: Insufficient documentation

## 2020-10-06 DIAGNOSIS — Z9151 Personal history of suicidal behavior: Secondary | ICD-10-CM | POA: Insufficient documentation

## 2020-10-06 DIAGNOSIS — F1290 Cannabis use, unspecified, uncomplicated: Secondary | ICD-10-CM | POA: Insufficient documentation

## 2020-10-06 DIAGNOSIS — Z560 Unemployment, unspecified: Secondary | ICD-10-CM | POA: Insufficient documentation

## 2020-10-06 DIAGNOSIS — F332 Major depressive disorder, recurrent severe without psychotic features: Secondary | ICD-10-CM | POA: Insufficient documentation

## 2020-10-07 ENCOUNTER — Other Ambulatory Visit: Payer: Self-pay

## 2020-10-07 ENCOUNTER — Ambulatory Visit (HOSPITAL_COMMUNITY)
Admission: EM | Admit: 2020-10-07 | Discharge: 2020-10-07 | Disposition: A | Discharge status: Home / Self Care | Payer: BC Managed Care – PPO | Attending: Student | Admitting: Student

## 2020-10-07 ENCOUNTER — Encounter (HOSPITAL_COMMUNITY): Payer: Self-pay | Admitting: Behavioral Health

## 2020-10-07 DIAGNOSIS — F419 Anxiety disorder, unspecified: Secondary | ICD-10-CM | POA: Insufficient documentation

## 2020-10-07 DIAGNOSIS — Z79899 Other long term (current) drug therapy: Secondary | ICD-10-CM | POA: Insufficient documentation

## 2020-10-07 DIAGNOSIS — Z560 Unemployment, unspecified: Secondary | ICD-10-CM | POA: Diagnosis not present

## 2020-10-07 DIAGNOSIS — G4700 Insomnia, unspecified: Secondary | ICD-10-CM | POA: Diagnosis not present

## 2020-10-07 DIAGNOSIS — R450 Nervousness: Secondary | ICD-10-CM | POA: Insufficient documentation

## 2020-10-07 DIAGNOSIS — F332 Major depressive disorder, recurrent severe without psychotic features: Secondary | ICD-10-CM

## 2020-10-07 DIAGNOSIS — R45851 Suicidal ideations: Secondary | ICD-10-CM | POA: Diagnosis not present

## 2020-10-07 DIAGNOSIS — R4588 Nonsuicidal self-harm: Secondary | ICD-10-CM

## 2020-10-07 DIAGNOSIS — Z9151 Personal history of suicidal behavior: Secondary | ICD-10-CM | POA: Diagnosis not present

## 2020-10-07 DIAGNOSIS — F1290 Cannabis use, unspecified, uncomplicated: Secondary | ICD-10-CM | POA: Diagnosis not present

## 2020-10-07 DIAGNOSIS — Z20822 Contact with and (suspected) exposure to covid-19: Secondary | ICD-10-CM | POA: Insufficient documentation

## 2020-10-07 DIAGNOSIS — Z9152 Personal history of nonsuicidal self-harm: Secondary | ICD-10-CM | POA: Insufficient documentation

## 2020-10-07 LAB — COMPREHENSIVE METABOLIC PANEL
ALT: 11 U/L (ref 0–44)
AST: 17 U/L (ref 15–41)
Albumin: 4.8 g/dL (ref 3.5–5.0)
Alkaline Phosphatase: 38 U/L (ref 38–126)
Anion gap: 14 (ref 5–15)
BUN: 19 mg/dL (ref 6–20)
CO2: 25 mmol/L (ref 22–32)
Calcium: 9.7 mg/dL (ref 8.9–10.3)
Chloride: 100 mmol/L (ref 98–111)
Creatinine, Ser: 0.88 mg/dL (ref 0.44–1.00)
GFR, Estimated: >60 mL/min (ref >60)
Glucose, Bld: 54 mg/dL — ABNORMAL LOW (ref 70–99)
Potassium: 3.4 mmol/L — ABNORMAL LOW (ref 3.5–5.1)
Sodium: 139 mmol/L (ref 135–145)
Total Bilirubin: 1.1 mg/dL (ref 0.3–1.2)
Total Protein: 8.5 g/dL — ABNORMAL HIGH (ref 6.5–8.1)

## 2020-10-07 LAB — CBC WITH DIFFERENTIAL/PLATELET
Abs Immature Granulocytes: 0.01 10*3/uL (ref 0.00–0.07)
Basophils Absolute: 0 10*3/uL (ref 0.0–0.1)
Basophils Relative: 1 %
Eosinophils Absolute: 0.1 10*3/uL (ref 0.0–0.5)
Eosinophils Relative: 1 %
HCT: 43 % (ref 36.0–46.0)
Hemoglobin: 14.2 g/dL (ref 12.0–15.0)
Immature Granulocytes: 0 %
Lymphocytes Relative: 47 %
Lymphs Abs: 2.2 10*3/uL (ref 0.7–4.0)
MCH: 31.8 pg (ref 26.0–34.0)
MCHC: 33 g/dL (ref 30.0–36.0)
MCV: 96.2 fL (ref 80.0–100.0)
Monocytes Absolute: 0.3 10*3/uL (ref 0.1–1.0)
Monocytes Relative: 7 %
Neutro Abs: 2 10*3/uL (ref 1.7–7.7)
Neutrophils Relative %: 44 %
Platelets: 225 10*3/uL (ref 150–400)
RBC: 4.47 MIL/uL (ref 3.87–5.11)
RDW: 12 % (ref 11.5–15.5)
WBC: 4.7 10*3/uL (ref 4.0–10.5)
nRBC: 0 % (ref 0.0–0.2)

## 2020-10-07 LAB — POCT URINE DRUG SCREEN - MANUAL ENTRY (I-SCREEN)
POC Amphetamine UR: NOT DETECTED
POC Buprenorphine (BUP): NOT DETECTED
POC Cocaine UR: NOT DETECTED
POC Marijuana UR: POSITIVE — AB
POC Methadone UR: NOT DETECTED
POC Methamphetamine UR: NOT DETECTED
POC Morphine: NOT DETECTED
POC Oxazepam (BZO): NOT DETECTED
POC Oxycodone UR: NOT DETECTED
POC Secobarbital (BAR): NOT DETECTED

## 2020-10-07 LAB — RESP PANEL BY RT-PCR (FLU A&B, COVID) ARPGX2
Influenza A by PCR: NEGATIVE
Influenza B by PCR: NEGATIVE
SARS Coronavirus 2 by RT PCR: NEGATIVE

## 2020-10-07 LAB — POCT PREGNANCY, URINE: Preg Test, Ur: NEGATIVE

## 2020-10-07 LAB — POC SARS CORONAVIRUS 2 AG -  ED: SARS Coronavirus 2 Ag: NEGATIVE

## 2020-10-07 MED ORDER — HYDROXYZINE HCL 25 MG PO TABS
25.0000 mg | ORAL_TABLET | Freq: Three times a day (TID) | ORAL | Status: DC | PRN
  Administered 2020-10-07: 25 mg via ORAL
  Filled 2020-10-07: qty 1

## 2020-10-07 MED ORDER — ESCITALOPRAM OXALATE 10 MG PO TABS: 10.0000 mg | ORAL_TABLET | Freq: Every day | ORAL | 0 refills | Status: DC

## 2020-10-07 MED ORDER — TRAZODONE HCL 50 MG PO TABS
50.0000 mg | ORAL_TABLET | Freq: Every evening | ORAL | Status: DC | PRN
  Administered 2020-10-07: 50 mg via ORAL
  Filled 2020-10-07: qty 1

## 2020-10-07 MED ORDER — ESCITALOPRAM OXALATE 10 MG PO TABS
10.0000 mg | ORAL_TABLET | Freq: Every day | ORAL | Status: DC
  Administered 2020-10-07: 10 mg via ORAL
  Filled 2020-10-07: qty 1

## 2020-10-07 MED ORDER — ALUM & MAG HYDROXIDE-SIMETH 200-200-20 MG/5ML PO SUSP: 30.0000 mL | ORAL | Status: DC | PRN

## 2020-10-07 MED ORDER — MAGNESIUM HYDROXIDE 400 MG/5ML PO SUSP: 30.0000 mL | Freq: Every day | ORAL | Status: DC | PRN

## 2020-10-07 MED ORDER — ACETAMINOPHEN 325 MG PO TABS: 650.0000 mg | ORAL_TABLET | Freq: Four times a day (QID) | ORAL | Status: DC | PRN

## 2020-10-07 NOTE — ED Notes (Signed)
Pt asleep with even and unlabored respirations. No distress or discomfort noted. Pt remains safe on the unit. Will continue to monitor. 
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
## 2020-10-07 NOTE — ED Triage Notes (Signed)
Pt arrived to [facility] from [facility] via [facility]. Pt endorsing depression and SI with plan to hang self, ingest bleach, or intentionally crash vehicle. Pt denies HI and AVH.

## 2020-10-07 NOTE — ED Notes (Addendum)
[facility] called for STAT labs, courier unavailable til 7am

## 2020-10-07 NOTE — Discharge Instructions (Signed)
Take all medications as prescribed. Keep all follow-up appointments as scheduled.  Do not consume alcohol or use illegal drugs while on prescription medications. Report any adverse effects from your medications to your primary care provider promptly.  In the event of recurrent symptoms or worsening symptoms, call 911, a crisis hotline, or go to the nearest emergency department for evaluation.   

## 2020-10-07 NOTE — Progress Notes (Signed)
Per Berneice Heinrich, NP, patient meets criteria for inpatient treatment. There are no available beds at [facility] currently. CSW faxed referrals to the following facilities for review:  [facility]  TTS will continue to seek bed placement.   Trula Slade, MSW, LCSW Clinical Social Worker 10/07/2020 10:42 AM

## 2020-10-07 NOTE — ED Notes (Signed)
Pt sleeping@this time. Breathing even and unlabored. Will continue to monitor for safety 

## 2020-10-07 NOTE — ED Notes (Signed)
Patient belongings in locker 71 Belonging sheet in chart
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
## 2020-10-07 NOTE — ED Notes (Signed)
Pt alert and oriented on the unit. Education, support, and encouragement provided. Discharge summary, medications and follow up appointments reviewed with pt. Suicide prevention resources provided. Pt's belongings in locker returned and belongings sheet signed. Pt denies SI/HI, A/VH, pain, or any concerns at this time. Pt ambulatory on and off unit. Pt discharged to lobby. 

## 2020-10-07 NOTE — ED Notes (Addendum)
Pt admitted to continuous assessment due to SI and depression. Pt A&O x4, calm and cooperative. Pt tolerated lab work and skin assessment well. Pt ambulated independently to unit. Oriented to unit/staff. No signs of acute distress noted. Will continue to monitor for safety.

## 2020-11-19 ENCOUNTER — Ambulatory Visit: Payer: BC Managed Care – PPO | Admitting: Adult Health

## 2021-06-04 DIAGNOSIS — Z8659 Personal history of other mental and behavioral disorders: Secondary | ICD-10-CM | POA: Diagnosis not present

## 2021-07-29 DIAGNOSIS — Z3A01 Less than 8 weeks gestation of pregnancy: Secondary | ICD-10-CM | POA: Diagnosis not present

## 2021-07-29 DIAGNOSIS — O210 Mild hyperemesis gravidarum: Secondary | ICD-10-CM | POA: Diagnosis not present

## 2021-08-11 NOTE — L&D Delivery Note (Signed)
Delivery Note At 8:49 PM a viable female was delivered via Vaginal, Spontaneous (Presentation: Right Occiput Anterior).  APGAR: 9, 9; weight pending .   Placenta status: Spontaneous, Intact.  Cord: 3 vessels with the following complications: None.   Anesthesia: Epidural Episiotomy: None Lacerations: Periurethral (Right side) Suture Repair: 3.0 vicryl Est. Blood Loss (mL): 80  Mom to postpartum.  Baby to Couplet care / Skin to Skin.  Prescilla Sours, MD.  03/26/2022, 9:34 PM

## 2021-08-14 DIAGNOSIS — Z3201 Encounter for pregnancy test, result positive: Secondary | ICD-10-CM | POA: Diagnosis not present

## 2021-08-14 LAB — OB RESULTS CONSOLE RUBELLA ANTIBODY, IGM: Rubella: IMMUNE

## 2021-08-14 LAB — OB RESULTS CONSOLE VARICELLA ZOSTER ANTIBODY, IGG: Varicella: IMMUNE

## 2021-08-14 LAB — OB RESULTS CONSOLE HEPATITIS B SURFACE ANTIGEN: Hepatitis B Surface Ag: NEGATIVE

## 2021-08-14 LAB — HEPATITIS C ANTIBODY: HCV Ab: NEGATIVE

## 2021-08-14 LAB — OB RESULTS CONSOLE HIV ANTIBODY (ROUTINE TESTING): HIV: NONREACTIVE

## 2021-08-21 DIAGNOSIS — O26899 Other specified pregnancy related conditions, unspecified trimester: Secondary | ICD-10-CM | POA: Diagnosis not present

## 2021-08-21 DIAGNOSIS — O26851 Spotting complicating pregnancy, first trimester: Secondary | ICD-10-CM | POA: Diagnosis not present

## 2021-09-06 ENCOUNTER — Other Ambulatory Visit (HOSPITAL_COMMUNITY)
Admission: RE | Admit: 2021-09-06 | Discharge: 2021-09-06 | Disposition: A | Discharge status: Home / Self Care | Payer: BC Managed Care – PPO | Source: Ambulatory Visit | Attending: Obstetrics and Gynecology | Admitting: Obstetrics and Gynecology

## 2021-09-06 ENCOUNTER — Other Ambulatory Visit: Payer: Self-pay | Admitting: Obstetrics and Gynecology

## 2021-09-06 DIAGNOSIS — N898 Other specified noninflammatory disorders of vagina: Secondary | ICD-10-CM | POA: Diagnosis not present

## 2021-09-06 DIAGNOSIS — Z3481 Encounter for supervision of other normal pregnancy, first trimester: Secondary | ICD-10-CM | POA: Diagnosis not present

## 2021-09-06 DIAGNOSIS — Z3490 Encounter for supervision of normal pregnancy, unspecified, unspecified trimester: Secondary | ICD-10-CM | POA: Diagnosis not present

## 2021-09-09 LAB — CYTOLOGY - PAP: Diagnosis: NEGATIVE

## 2021-09-11 DIAGNOSIS — Z3481 Encounter for supervision of other normal pregnancy, first trimester: Secondary | ICD-10-CM | POA: Diagnosis not present

## 2021-10-07 DIAGNOSIS — Z3482 Encounter for supervision of other normal pregnancy, second trimester: Secondary | ICD-10-CM | POA: Diagnosis not present

## 2021-10-07 DIAGNOSIS — Z3490 Encounter for supervision of normal pregnancy, unspecified, unspecified trimester: Secondary | ICD-10-CM | POA: Diagnosis not present

## 2021-11-05 DIAGNOSIS — O26872 Cervical shortening, second trimester: Secondary | ICD-10-CM | POA: Diagnosis not present

## 2021-11-05 DIAGNOSIS — Z36 Encounter for antenatal screening for chromosomal anomalies: Secondary | ICD-10-CM | POA: Diagnosis not present

## 2021-11-19 ENCOUNTER — Other Ambulatory Visit: Payer: Self-pay | Admitting: Obstetrics and Gynecology

## 2021-11-19 DIAGNOSIS — O26872 Cervical shortening, second trimester: Secondary | ICD-10-CM | POA: Diagnosis not present

## 2021-11-19 DIAGNOSIS — Z3689 Encounter for other specified antenatal screening: Secondary | ICD-10-CM

## 2021-11-26 ENCOUNTER — Telehealth: Payer: Self-pay

## 2021-11-27 ENCOUNTER — Encounter: Payer: Self-pay | Admitting: *Deleted

## 2021-11-28 ENCOUNTER — Inpatient Hospital Stay (HOSPITAL_COMMUNITY)
Admission: AD | Admit: 2021-11-28 | Discharge: 2021-11-28 | Disposition: A | Discharge status: Home / Self Care | Payer: BC Managed Care – PPO | Attending: Obstetrics and Gynecology | Admitting: Obstetrics and Gynecology

## 2021-11-28 ENCOUNTER — Other Ambulatory Visit: Payer: Self-pay

## 2021-11-28 ENCOUNTER — Inpatient Hospital Stay (HOSPITAL_BASED_OUTPATIENT_CLINIC_OR_DEPARTMENT_OTHER): Payer: BC Managed Care – PPO

## 2021-11-28 ENCOUNTER — Encounter (HOSPITAL_COMMUNITY): Payer: Self-pay | Admitting: Obstetrics and Gynecology

## 2021-11-28 DIAGNOSIS — O26879 Cervical shortening, unspecified trimester: Secondary | ICD-10-CM | POA: Diagnosis not present

## 2021-11-28 DIAGNOSIS — O26872 Cervical shortening, second trimester: Secondary | ICD-10-CM | POA: Insufficient documentation

## 2021-11-28 DIAGNOSIS — O99891 Other specified diseases and conditions complicating pregnancy: Secondary | ICD-10-CM | POA: Diagnosis not present

## 2021-11-28 DIAGNOSIS — Z3A22 22 weeks gestation of pregnancy: Secondary | ICD-10-CM | POA: Diagnosis not present

## 2021-11-28 DIAGNOSIS — R102 Pelvic and perineal pain: Secondary | ICD-10-CM

## 2021-11-28 HISTORY — DX: Herpesviral infection of urogenital system, unspecified: A60.00

## 2021-11-28 LAB — URINALYSIS, ROUTINE W REFLEX MICROSCOPIC
Bilirubin Urine: NEGATIVE
Glucose, UA: NEGATIVE mg/dL
Hgb urine dipstick: NEGATIVE
Ketones, ur: NEGATIVE mg/dL
Leukocytes,Ua: NEGATIVE
Nitrite: NEGATIVE
Protein, ur: NEGATIVE mg/dL
Specific Gravity, Urine: 1.009 (ref 1.005–1.030)
pH: 6 (ref 5.0–8.0)

## 2021-11-28 LAB — WET PREP, GENITAL
Clue Cells Wet Prep HPF POC: NONE SEEN
Sperm: NONE SEEN
Trich, Wet Prep: NONE SEEN
WBC, Wet Prep HPF POC: <10 (ref <10)
Yeast Wet Prep HPF POC: NONE SEEN

## 2021-11-28 NOTE — MAU Note (Signed)
Elizabeth Becker is a 26 y.o. at [redacted]w[redacted]d here in MAU reporting: lower abdominal pain that started at 10:00 AM this morning. The pain is intermittent and worse when sitting. The pt states she took Tylenol at 1330 today with no relief. No bleeding or vaginal discharge.  ? ?Onset of complaint: Today ?Pain score: 9 ?Vitals:  ? 11/28/21 1618 11/28/21 1621  ?BP: 110/66 112/64  ?Pulse: 97 90  ?Resp:  16  ?Temp:  98.2 ?F (36.8 ?C)  ?   ?FHT:148 ?Lab orders placed from triage:  UA ?

## 2021-11-28 NOTE — MAU Provider Note (Addendum)
?History  ?  ? ?CSN: [id number] ? ?Arrival date and time: 11/28/21 1551 ? ? Event Date/Time  ? First Provider Initiated Contact with Patient 11/28/21 1639   ?  ? ?Chief Complaint  ?Patient presents with  ? Abdominal Pain  ? ?26 year old G3 P0-0-2-0 at 22.5 weeks x 8-week ultrasound presenting with LAP.  Reports onset of intermittent sharp RLQ pain which started this morning.  Reports 3-4 episodes.  The last episode occurred while she was voiding and was very intense and painful.  States her pain has now subsided.  Denies vaginal bleeding, vaginal discharge, or LOF.  Denies urinary symptoms.  No GI symptoms.  Had normal BM today.  No recent intercourse.  Reports good fetal movement. ? ?OB History   ? ? Gravida  ?3  ? Para  ?   ? Term  ?   ? Preterm  ?   ? AB  ?2  ? Living  ?   ?  ? ? SAB  ?   ? IAB  ?2  ? Ectopic  ?   ? Multiple  ?   ? Live Births  ?   ?   ?  ?  ? ? ?Past Medical History:  ?Diagnosis Date  ? Anxiety   ? Depression   ? Genital herpes   ? Loss of consciousness (HCC)   ? Migraine   ? Vision abnormalities   ? wears glasses for distance  ? ? ?Past Surgical History:  ?Procedure Laterality Date  ? NO PAST SURGERIES    ? WISDOM TOOTH EXTRACTION    ? WOUND EXPLORATION Right 08/07/2020  ? Procedure: EXPLORATION RIGHT RING FINGER LACERATION;  Surgeon: Betha Loa, MD;  Location: [facility];  Service: Orthopedics;  Laterality: Right;  ? ? ?Family History  ?Problem Relation Age of Onset  ? Healthy Mother   ? Healthy Father   ? ? ?Social History  ? ?Tobacco Use  ? Smoking status: Never  ? Smokeless tobacco: Never  ?Vaping Use  ? Vaping Use: Never used  ?Substance Use Topics  ? Alcohol use: No  ?  Alcohol/week: 0.0 standard drinks  ? Drug use: Not Currently  ?  Comment: tried marijuana last use 1 year ago  ? ? ?Allergies: No Known Allergies ? ?No medications prior to admission.  ? ? ?Review of Systems  ?Constitutional:  Negative for fever.  ?Gastrointestinal:  Positive for abdominal pain. Negative  for constipation, diarrhea, nausea and vomiting.  ?Genitourinary:  Negative for dysuria, frequency, urgency, vaginal bleeding and vaginal discharge.  ?Physical Exam  ? ?Blood pressure 127/75, pulse 93, temperature 98.2 ?F (36.8 ?C), temperature source Oral, resp. rate 16, last menstrual period 06/15/2021. ? ?Physical Exam ?Vitals and nursing note reviewed. Exam conducted with a chaperone present.  ?Constitutional:   ?   General: She is not in acute distress. ?   Appearance: Normal appearance.  ?HENT:  ?   Head: Normocephalic and atraumatic.  ?Cardiovascular:  ?   Rate and Rhythm: Normal rate.  ?Pulmonary:  ?   Effort: Pulmonary effort is normal.  ?   Breath sounds: Normal breath sounds.  ?Abdominal:  ?   Palpations: Abdomen is soft.  ?   Tenderness: There is no abdominal tenderness.  ?Genitourinary: ?   Comments: Ve: Closed thick ?Musculoskeletal:     ?   General: Normal range of motion.  ?   Cervical back: Normal range of motion.  ?Skin: ?   General: Skin is warm and  dry.  ?Neurological:  ?   General: No focal deficit present.  ?   Mental Status: She is alert and oriented to person, place, and time.  ?Psychiatric:     ?   Mood and Affect: Mood normal.     ?   Behavior: Behavior normal.  ?FHT: 148 ? ?Results for orders placed or performed during the hospital encounter of 11/28/21 (from the past 24 hour(s))  ?Urinalysis, Routine w reflex microscopic Urine, Clean Catch     Status: None  ? Collection Time: 11/28/21  4:12 PM  ?Result Value Ref Range  ? Color, Urine YELLOW YELLOW  ? APPearance CLEAR CLEAR  ? Specific Gravity, Urine 1.009 1.005 - 1.030  ? pH 6.0 5.0 - 8.0  ? Glucose, UA NEGATIVE NEGATIVE mg/dL  ? Hgb urine dipstick NEGATIVE NEGATIVE  ? Bilirubin Urine NEGATIVE NEGATIVE  ? Ketones, ur NEGATIVE NEGATIVE mg/dL  ? Protein, ur NEGATIVE NEGATIVE mg/dL  ? Nitrite NEGATIVE NEGATIVE  ? Leukocytes,Ua NEGATIVE NEGATIVE  ?Wet prep, genital     Status: None  ? Collection Time: 11/28/21  4:54 PM  ? Specimen: PATH  Cytology Cervicovaginal Ancillary Only  ?Result Value Ref Range  ? Yeast Wet Prep HPF POC NONE SEEN NONE SEEN  ? Trich, Wet Prep NONE SEEN NONE SEEN  ? Clue Cells Wet Prep HPF POC NONE SEEN NONE SEEN  ? WBC, Wet Prep HPF POC <10 <10  ? Sperm NONE SEEN   ? ?MAU Course  ?Procedures ? ?MDM ?Prenatal records reviewed.  Pregnancy complicated by cervical shortening, last ultrasound 2 weeks ago showed cervical length of 2.7 cm and down to 2.0 cm with fundal pressure and mild funneling, on vaginal progesterone. ?Labs ordered and reviewed.  No signs of UTI, infection, or PTL.  Ultrasound ordered and reviewed> cervical length now 1.5 cm.  Consult with Dr. Charlotta Newton who discussed findings with Dr. Connye Burkitt.  Patient no longer having pain, toco quiet. Plan for outpatient management.  Patient has MFM ultrasound tomorrow.  Encouraged to keep appointment.  Stable for discharge. ? ?Assessment and Plan  ? ?1. [redacted] weeks gestation of pregnancy   ?2. Cervical shortening affecting pregnancy   ? ?Discharge home ?Follow[facility]up with [facility] as scheduled ?Follow-up with MFM tomorrow as scheduled ?Strict return precautions ?Pelvic rest ?Continue vaginal progesterone ? ?Allergies as of 11/28/2021   ?No Known Allergies ?  ? ?  ?Medication List  ?  ? ?STOP taking these medications   ? ?norethindrone-ethinyl estradiol 0.5/0.75/1-35 MG-MCG tablet ?Commonly known as: CYCLAFEM ?  ?rizatriptan 5 MG disintegrating tablet ?Commonly known as: Maxalt-MLT ?  ? ?  ? ?TAKE these medications   ? ?escitalopram 10 MG tablet ?Commonly known as: LEXAPRO ?Take 1 tablet (10 mg total) by mouth at bedtime. ?  ? ?  ? ? ?Donette Larry, CNM ?11/28/2021, 6:28 PM  ?

## 2021-11-29 ENCOUNTER — Ambulatory Visit (HOSPITAL_BASED_OUTPATIENT_CLINIC_OR_DEPARTMENT_OTHER): Payer: BC Managed Care – PPO

## 2021-11-29 ENCOUNTER — Other Ambulatory Visit: Payer: Self-pay

## 2021-11-29 ENCOUNTER — Other Ambulatory Visit: Payer: Self-pay | Admitting: *Deleted

## 2021-11-29 ENCOUNTER — Encounter: Payer: Self-pay | Admitting: *Deleted

## 2021-11-29 ENCOUNTER — Ambulatory Visit: Payer: BC Managed Care – PPO

## 2021-11-29 ENCOUNTER — Other Ambulatory Visit: Payer: Self-pay | Admitting: Obstetrics and Gynecology

## 2021-11-29 ENCOUNTER — Ambulatory Visit: Payer: BC Managed Care – PPO | Attending: Obstetrics and Gynecology | Admitting: *Deleted

## 2021-11-29 VITALS — BP 112/68 | HR 95

## 2021-11-29 DIAGNOSIS — O26879 Cervical shortening, unspecified trimester: Secondary | ICD-10-CM

## 2021-11-29 DIAGNOSIS — Z3A22 22 weeks gestation of pregnancy: Secondary | ICD-10-CM | POA: Diagnosis not present

## 2021-11-29 DIAGNOSIS — O26872 Cervical shortening, second trimester: Secondary | ICD-10-CM | POA: Insufficient documentation

## 2021-11-29 DIAGNOSIS — Z3689 Encounter for other specified antenatal screening: Secondary | ICD-10-CM | POA: Diagnosis not present

## 2021-11-29 DIAGNOSIS — Z363 Encounter for antenatal screening for malformations: Secondary | ICD-10-CM | POA: Diagnosis not present

## 2021-11-29 LAB — GC/CHLAMYDIA PROBE AMP (~~LOC~~) NOT AT ARMC
Chlamydia: NEGATIVE
Comment: NEGATIVE
Comment: NORMAL
Neisseria Gonorrhea: NEGATIVE

## 2021-12-05 ENCOUNTER — Ambulatory Visit: Payer: BC Managed Care – PPO | Admitting: *Deleted

## 2021-12-05 ENCOUNTER — Ambulatory Visit: Payer: BC Managed Care – PPO | Attending: Maternal & Fetal Medicine

## 2021-12-05 VITALS — BP 112/67 | HR 81

## 2021-12-05 DIAGNOSIS — Z79890 Hormone replacement therapy (postmenopausal): Secondary | ICD-10-CM | POA: Diagnosis not present

## 2021-12-05 DIAGNOSIS — O26879 Cervical shortening, unspecified trimester: Secondary | ICD-10-CM

## 2021-12-05 DIAGNOSIS — Z363 Encounter for antenatal screening for malformations: Secondary | ICD-10-CM | POA: Insufficient documentation

## 2021-12-05 DIAGNOSIS — Z3686 Encounter for antenatal screening for cervical length: Secondary | ICD-10-CM | POA: Insufficient documentation

## 2021-12-05 DIAGNOSIS — O26872 Cervical shortening, second trimester: Secondary | ICD-10-CM

## 2021-12-05 DIAGNOSIS — Z3A23 23 weeks gestation of pregnancy: Secondary | ICD-10-CM | POA: Diagnosis not present

## 2021-12-31 DIAGNOSIS — Z3482 Encounter for supervision of other normal pregnancy, second trimester: Secondary | ICD-10-CM | POA: Diagnosis not present

## 2021-12-31 DIAGNOSIS — Z3490 Encounter for supervision of normal pregnancy, unspecified, unspecified trimester: Secondary | ICD-10-CM | POA: Diagnosis not present

## 2021-12-31 LAB — OB RESULTS CONSOLE RPR: RPR: NONREACTIVE

## 2022-01-14 DIAGNOSIS — O26873 Cervical shortening, third trimester: Secondary | ICD-10-CM | POA: Diagnosis not present

## 2022-01-28 DIAGNOSIS — Z23 Encounter for immunization: Secondary | ICD-10-CM | POA: Diagnosis not present

## 2022-02-10 DIAGNOSIS — O3433 Maternal care for cervical incompetence, third trimester: Secondary | ICD-10-CM | POA: Diagnosis not present

## 2022-03-03 DIAGNOSIS — Z3483 Encounter for supervision of other normal pregnancy, third trimester: Secondary | ICD-10-CM | POA: Diagnosis not present

## 2022-03-03 DIAGNOSIS — Z3490 Encounter for supervision of normal pregnancy, unspecified, unspecified trimester: Secondary | ICD-10-CM | POA: Diagnosis not present

## 2022-03-03 LAB — OB RESULTS CONSOLE GBS: GBS: NEGATIVE

## 2022-03-25 ENCOUNTER — Inpatient Hospital Stay (EMERGENCY_DEPARTMENT_HOSPITAL)
Admission: AD | Admit: 2022-03-25 | Discharge: 2022-03-25 | Disposition: A | Discharge status: Home / Self Care | Payer: BC Managed Care – PPO | Source: Home / Self Care | Attending: Obstetrics and Gynecology | Admitting: Obstetrics and Gynecology

## 2022-03-25 ENCOUNTER — Encounter (HOSPITAL_COMMUNITY): Payer: Self-pay | Admitting: Obstetrics and Gynecology

## 2022-03-25 DIAGNOSIS — O368130 Decreased fetal movements, third trimester, not applicable or unspecified: Secondary | ICD-10-CM | POA: Diagnosis not present

## 2022-03-25 DIAGNOSIS — O4292 Full-term premature rupture of membranes, unspecified as to length of time between rupture and onset of labor: Secondary | ICD-10-CM | POA: Diagnosis not present

## 2022-03-25 DIAGNOSIS — Z3A39 39 weeks gestation of pregnancy: Secondary | ICD-10-CM | POA: Diagnosis not present

## 2022-03-25 DIAGNOSIS — Z3689 Encounter for other specified antenatal screening: Secondary | ICD-10-CM

## 2022-03-25 DIAGNOSIS — F418 Other specified anxiety disorders: Secondary | ICD-10-CM | POA: Diagnosis not present

## 2022-03-25 DIAGNOSIS — O26893 Other specified pregnancy related conditions, third trimester: Secondary | ICD-10-CM | POA: Diagnosis not present

## 2022-03-25 DIAGNOSIS — O99344 Other mental disorders complicating childbirth: Secondary | ICD-10-CM | POA: Diagnosis not present

## 2022-03-25 DIAGNOSIS — O26873 Cervical shortening, third trimester: Secondary | ICD-10-CM | POA: Diagnosis not present

## 2022-03-25 DIAGNOSIS — O9832 Other infections with a predominantly sexual mode of transmission complicating childbirth: Secondary | ICD-10-CM | POA: Diagnosis not present

## 2022-03-25 DIAGNOSIS — A6000 Herpesviral infection of urogenital system, unspecified: Secondary | ICD-10-CM | POA: Diagnosis not present

## 2022-03-25 DIAGNOSIS — Z23 Encounter for immunization: Secondary | ICD-10-CM | POA: Diagnosis not present

## 2022-03-25 NOTE — MAU Note (Signed)
.  Elizabeth Becker is a 26 y.o. at [redacted]w[redacted]d here in MAU reporting: DFM since 1300 or 1400. She noticed 3 kicks at that time while eating lunch and no movement since then. Denies VB, LOF, or pain.   Onset of complaint: today Pain score: 0/10 Vitals:   03/25/22 1821  BP: 128/74  Pulse: 95  Resp: 16  Temp: 98 F (36.7 C)  SpO2: 97%     FHT:148

## 2022-03-25 NOTE — MAU Provider Note (Signed)
History     [id number]  Arrival date and time: 03/25/22 1813    Chief Complaint  Patient presents with   Decreased Fetal Movement     HPI Elizabeth Becker is a 26 y.o. at [redacted]w[redacted]d who presents for decreased fetal movement. Since 1 pm had only felt baby move 3 times. Since arriving to MAU reports increase in movement. Denies abdominal pain or vaginal bleeding. Sees Dr. Richardson Dopp for prenatal care & has appointment with her on Monday.    OB History     Gravida  3   Para      Term      Preterm      AB  2   Living         SAB  0   IAB  2   Ectopic      Multiple      Live Births              Past Medical History:  Diagnosis Date   Anxiety    Depression    Genital herpes    Loss of consciousness (HCC)    Migraine    Vision abnormalities    wears glasses for distance    Past Surgical History:  Procedure Laterality Date   WISDOM TOOTH EXTRACTION     WOUND EXPLORATION Right 08/07/2020   Procedure: EXPLORATION RIGHT RING FINGER LACERATION;  Surgeon: Betha Loa, MD;  [facility];  Service: Orthopedics;  Laterality: Right;    Family History  Problem Relation Age of Onset   Healthy Mother    Healthy Father     No Known Allergies  No current facility-administered medications on file prior to encounter.   Current Outpatient Medications on File Prior to Encounter  Medication Sig Dispense Refill   Prenatal Vit-Fe Fumarate-FA (PRENATAL VITAMINS PO) Take by mouth.     valACYclovir (VALTREX) 1000 MG tablet Take 1,000 mg by mouth 2 (two) times daily.     progesterone (PROMETRIUM) 200 MG capsule Take 200 mg by mouth daily.       ROS Pertinent positives and negative per HPI, all others reviewed and negative  Physical Exam   BP 115/73   Pulse 96   Temp 98 F (36.7 C) (Oral)   Resp 16   Ht 5\' 7"  (1.702 m)   Wt 79.8 kg   LMP 06/15/2021   SpO2 97%   BMI 27.57 kg/m   Patient Vitals for the past 24 hrs:  BP Temp Temp src Pulse  Resp SpO2 Height Weight  03/25/22 1826 115/73 -- -- 96 -- -- -- --  03/25/22 1821 128/74 98 F (36.7 C) Oral 95 16 97 % 5\' 7"  (1.702 m) 79.8 kg    Physical Exam Vitals and nursing note reviewed.  Constitutional:      General: She is not in acute distress.    Appearance: Normal appearance.  HENT:     Head: Normocephalic and atraumatic.  Pulmonary:     Effort: Pulmonary effort is normal. No respiratory distress.  Abdominal:     Palpations: Abdomen is soft.     Tenderness: There is no abdominal tenderness.  Skin:    General: Skin is warm and dry.  Neurological:     Mental Status: She is alert.      FHT Baseline 145, moderate variability, 15x15 accels, no decels Toco: irregular Cat: 1  Labs No results found for this or any previous visit (from the past 24 hour(s)).  Imaging  No results found.  MAU Course  Procedures Lab Orders  No laboratory test(s) ordered today   No orders of the defined types were placed in this encounter.  Imaging Orders  No imaging studies ordered today    MDM Patient documented 21 movements while on monitor in MAU & fetal tracing reactive. Tracing reviewed by Dr. Para March.   Dr. Richardson Dopp notified of patient's presentation, return of movement, and tracing. Ok for discharge home.  Assessment and Plan   1. Decreased fetal movements in third trimester, single or unspecified fetus   2. NST (non-stress test) reactive   3. [redacted] weeks gestation of pregnancy    -reviewed kick counts with patient & encouraged to return for any concerns regarding fetal movement.   Judeth Horn, NP 03/25/22 7:08 PM

## 2022-03-26 ENCOUNTER — Encounter (HOSPITAL_COMMUNITY): Payer: Self-pay | Admitting: Obstetrics and Gynecology

## 2022-03-26 ENCOUNTER — Inpatient Hospital Stay (HOSPITAL_COMMUNITY): Payer: BC Managed Care – PPO | Admitting: Anesthesiology

## 2022-03-26 ENCOUNTER — Inpatient Hospital Stay (HOSPITAL_COMMUNITY)
Admission: AD | Admit: 2022-03-26 | Discharge: 2022-03-28 | DRG: 806 | Disposition: A | Discharge status: Home / Self Care | Payer: BC Managed Care – PPO | Attending: Obstetrics and Gynecology | Admitting: Obstetrics and Gynecology

## 2022-03-26 ENCOUNTER — Other Ambulatory Visit: Payer: Self-pay

## 2022-03-26 DIAGNOSIS — O9832 Other infections with a predominantly sexual mode of transmission complicating childbirth: Secondary | ICD-10-CM | POA: Diagnosis present

## 2022-03-26 DIAGNOSIS — A6000 Herpesviral infection of urogenital system, unspecified: Secondary | ICD-10-CM | POA: Diagnosis present

## 2022-03-26 DIAGNOSIS — Z3A39 39 weeks gestation of pregnancy: Secondary | ICD-10-CM

## 2022-03-26 DIAGNOSIS — O26873 Cervical shortening, third trimester: Secondary | ICD-10-CM | POA: Diagnosis present

## 2022-03-26 DIAGNOSIS — O4292 Full-term premature rupture of membranes, unspecified as to length of time between rupture and onset of labor: Secondary | ICD-10-CM | POA: Diagnosis present

## 2022-03-26 DIAGNOSIS — O26893 Other specified pregnancy related conditions, third trimester: Secondary | ICD-10-CM | POA: Diagnosis present

## 2022-03-26 LAB — CBC
HCT: 36.3 % (ref 36.0–46.0)
Hemoglobin: 12.4 g/dL (ref 12.0–15.0)
MCH: 32.9 pg (ref 26.0–34.0)
MCHC: 34.2 g/dL (ref 30.0–36.0)
MCV: 96.3 fL (ref 80.0–100.0)
Platelets: 215 10*3/uL (ref 150–400)
RBC: 3.77 MIL/uL — ABNORMAL LOW (ref 3.87–5.11)
RDW: 13.8 % (ref 11.5–15.5)
WBC: 7 10*3/uL (ref 4.0–10.5)
nRBC: 0 % (ref 0.0–0.2)

## 2022-03-26 LAB — TYPE AND SCREEN
ABO/RH(D): A POS
Antibody Screen: NEGATIVE

## 2022-03-26 LAB — POCT FERN TEST
POCT Fern Test: NEGATIVE
POCT Fern Test: POSITIVE

## 2022-03-26 LAB — RPR: RPR Ser Ql: NONREACTIVE

## 2022-03-26 MED ORDER — SENNOSIDES-DOCUSATE SODIUM 8.6-50 MG PO TABS
2.0000 | ORAL_TABLET | Freq: Every day | ORAL | Status: DC
  Administered 2022-03-27 – 2022-03-28 (×2): 2 via ORAL
  Filled 2022-03-26 (×2): qty 2

## 2022-03-26 MED ORDER — ACETAMINOPHEN 325 MG PO TABS: 650.0000 mg | ORAL_TABLET | ORAL | Status: DC | PRN

## 2022-03-26 MED ORDER — VALACYCLOVIR HCL 500 MG PO TABS
1000.0000 mg | ORAL_TABLET | Freq: Every day | ORAL | Status: DC
  Administered 2022-03-27 – 2022-03-28 (×2): 1000 mg via ORAL
  Filled 2022-03-26 (×2): qty 2

## 2022-03-26 MED ORDER — OXYCODONE HCL 5 MG PO TABS: 10.0000 mg | ORAL_TABLET | ORAL | Status: DC | PRN

## 2022-03-26 MED ORDER — SOD CITRATE-CITRIC ACID 500-334 MG/5ML PO SOLN: 30.0000 mL | ORAL | Status: DC | PRN

## 2022-03-26 MED ORDER — FENTANYL CITRATE (PF) 100 MCG/2ML IJ SOLN
50.0000 ug | INTRAMUSCULAR | Status: DC | PRN
  Administered 2022-03-26 (×2): 50 ug via INTRAVENOUS
  Filled 2022-03-26: qty 2

## 2022-03-26 MED ORDER — EPHEDRINE 5 MG/ML INJ: 10.0000 mg | INTRAVENOUS | Status: DC | PRN

## 2022-03-26 MED ORDER — COCONUT OIL OIL
1.0000 | TOPICAL_OIL | Status: DC | PRN
Start: 2022-03-26 — End: 2022-03-28

## 2022-03-26 MED ORDER — LACTATED RINGERS IV SOLN: 500.0000 mL | INTRAVENOUS | Status: DC | PRN

## 2022-03-26 MED ORDER — OXYTOCIN-SODIUM CHLORIDE 30-0.9 UT/500ML-% IV SOLN
1.0000 m[IU]/min | INTRAVENOUS | Status: DC
  Administered 2022-03-26: 1 m[IU]/min via INTRAVENOUS
  Filled 2022-03-26: qty 500

## 2022-03-26 MED ORDER — ONDANSETRON HCL 4 MG/2ML IJ SOLN: 4.0000 mg | INTRAMUSCULAR | Status: DC | PRN

## 2022-03-26 MED ORDER — PHENYLEPHRINE 80 MCG/ML (10ML) SYRINGE FOR IV PUSH (FOR BLOOD PRESSURE SUPPORT): 80.0000 ug | PREFILLED_SYRINGE | INTRAVENOUS | Status: DC | PRN

## 2022-03-26 MED ORDER — WITCH HAZEL-GLYCERIN EX PADS: 1.0000 | MEDICATED_PAD | CUTANEOUS | Status: DC | PRN

## 2022-03-26 MED ORDER — ONDANSETRON HCL 4 MG/2ML IJ SOLN: 4.0000 mg | Freq: Four times a day (QID) | INTRAMUSCULAR | Status: DC | PRN

## 2022-03-26 MED ORDER — DIPHENHYDRAMINE HCL 50 MG/ML IJ SOLN: 12.5000 mg | INTRAMUSCULAR | Status: DC | PRN

## 2022-03-26 MED ORDER — LACTATED RINGERS IV SOLN
500.0000 mL | Freq: Once | INTRAVENOUS | Status: AC
  Administered 2022-03-26: 500 mL via INTRAVENOUS

## 2022-03-26 MED ORDER — IBUPROFEN 600 MG PO TABS
600.0000 mg | ORAL_TABLET | Freq: Four times a day (QID) | ORAL | Status: DC
  Administered 2022-03-27 – 2022-03-28 (×7): 600 mg via ORAL
  Filled 2022-03-26 (×7): qty 1

## 2022-03-26 MED ORDER — DIPHENHYDRAMINE HCL 25 MG PO CAPS: 25.0000 mg | ORAL_CAPSULE | Freq: Four times a day (QID) | ORAL | Status: DC | PRN

## 2022-03-26 MED ORDER — OXYTOCIN-SODIUM CHLORIDE 30-0.9 UT/500ML-% IV SOLN
2.5000 [IU]/h | INTRAVENOUS | Status: DC
  Administered 2022-03-26: 2.5 [IU]/h via INTRAVENOUS

## 2022-03-26 MED ORDER — OXYCODONE-ACETAMINOPHEN 5-325 MG PO TABS: 2.0000 | ORAL_TABLET | ORAL | Status: DC | PRN

## 2022-03-26 MED ORDER — LIDOCAINE HCL (PF) 1 % IJ SOLN: 30.0000 mL | INTRAMUSCULAR | Status: DC | PRN

## 2022-03-26 MED ORDER — LIDOCAINE-EPINEPHRINE (PF) 2 %-1:200000 IJ SOLN
INTRAMUSCULAR | Status: DC | PRN
  Administered 2022-03-26: 5 mL via EPIDURAL

## 2022-03-26 MED ORDER — FENTANYL-BUPIVACAINE-NACL 0.5-0.125-0.9 MG/250ML-% EP SOLN
12.0000 mL/h | EPIDURAL | Status: DC | PRN
  Filled 2022-03-26: qty 250

## 2022-03-26 MED ORDER — VALACYCLOVIR HCL 500 MG PO TABS: 1000.0000 mg | ORAL_TABLET | Freq: Two times a day (BID) | ORAL | Status: DC

## 2022-03-26 MED ORDER — ONDANSETRON HCL 4 MG PO TABS: 4.0000 mg | ORAL_TABLET | ORAL | Status: DC | PRN

## 2022-03-26 MED ORDER — DIBUCAINE (PERIANAL) 1 % EX OINT: 1.0000 | TOPICAL_OINTMENT | CUTANEOUS | Status: DC | PRN

## 2022-03-26 MED ORDER — OXYTOCIN BOLUS FROM INFUSION
333.0000 mL | Freq: Once | INTRAVENOUS | Status: AC
  Administered 2022-03-26: 333 mL via INTRAVENOUS

## 2022-03-26 MED ORDER — PRENATAL MULTIVITAMIN CH
1.0000 | ORAL_TABLET | Freq: Every day | ORAL | Status: DC
  Administered 2022-03-27 – 2022-03-28 (×2): 1 via ORAL
  Filled 2022-03-26 (×2): qty 1

## 2022-03-26 MED ORDER — LACTATED RINGERS IV SOLN: INTRAVENOUS | Status: DC

## 2022-03-26 MED ORDER — OXYCODONE HCL 5 MG PO TABS: 5.0000 mg | ORAL_TABLET | ORAL | Status: DC | PRN

## 2022-03-26 MED ORDER — BENZOCAINE-MENTHOL 20-0.5 % EX AERO
1.0000 | INHALATION_SPRAY | CUTANEOUS | Status: DC | PRN
  Administered 2022-03-27 – 2022-03-28 (×2): 1 via TOPICAL
  Filled 2022-03-26 (×2): qty 56

## 2022-03-26 MED ORDER — OXYCODONE-ACETAMINOPHEN 5-325 MG PO TABS: 1.0000 | ORAL_TABLET | ORAL | Status: DC | PRN

## 2022-03-26 MED ORDER — ZOLPIDEM TARTRATE 5 MG PO TABS: 5.0000 mg | ORAL_TABLET | Freq: Every evening | ORAL | Status: DC | PRN

## 2022-03-26 MED ORDER — SIMETHICONE 80 MG PO CHEW: 80.0000 mg | CHEWABLE_TABLET | ORAL | Status: DC | PRN

## 2022-03-26 MED ORDER — FENTANYL-BUPIVACAINE-NACL 0.5-0.125-0.9 MG/250ML-% EP SOLN
EPIDURAL | Status: DC | PRN
Start: 2022-03-26 — End: 2022-03-26
  Administered 2022-03-26: 12 mL/h via EPIDURAL

## 2022-03-26 NOTE — Anesthesia Preprocedure Evaluation (Signed)
Anesthesia Evaluation  Patient identified by MRN, date of birth, ID band Patient awake    Reviewed: Allergy & Precautions, NPO status , Patient's Chart, lab work & pertinent test results  Airway Mallampati: II  TM Distance: >3 FB Neck ROM: Full    Dental no notable dental hx.    Pulmonary neg pulmonary ROS, former smoker,    Pulmonary exam normal breath sounds clear to auscultation       Cardiovascular negative cardio ROS Normal cardiovascular exam Rhythm:Regular Rate:Normal     Neuro/Psych  Headaches, PSYCHIATRIC DISORDERS Anxiety Depression    GI/Hepatic negative GI ROS, Neg liver ROS,   Endo/Other  negative endocrine ROS  Renal/GU negative Renal ROS  negative genitourinary   Musculoskeletal negative musculoskeletal ROS (+)   Abdominal   Peds  Hematology negative hematology ROS (+)   Anesthesia Other Findings   Reproductive/Obstetrics (+) Pregnancy                             Anesthesia Physical Anesthesia Plan  ASA: 2  Anesthesia Plan: Epidural   Post-op Pain Management:    Induction:   PONV Risk Score and Plan: Treatment may vary due to age or medical condition  Airway Management Planned: Natural Airway  Additional Equipment:   Intra-op Plan:   Post-operative Plan:   Informed Consent: I have reviewed the patients History and Physical, chart, labs and discussed the procedure including the risks, benefits and alternatives for the proposed anesthesia with the patient or authorized representative who has indicated his/her understanding and acceptance.       Plan Discussed with: Anesthesiologist  Anesthesia Plan Comments: (Patient identified. Risks, benefits, options discussed with patient including but not limited to bleeding, infection, nerve damage, paralysis, failed block, incomplete pain control, headache, blood pressure changes, nausea, vomiting, reactions to  medication, itching, and post partum back pain. Confirmed with bedside nurse the patient's most recent platelet count. Confirmed with the patient that they are not taking any anticoagulation, have any bleeding history or any family history of bleeding disorders. Patient expressed understanding and wishes to proceed. All questions were answered. )        Anesthesia Quick Evaluation

## 2022-03-26 NOTE — Anesthesia Procedure Notes (Addendum)
Epidural Patient location during procedure: OB Start time: 03/26/2022 5:30 PM End time: 03/26/2022 5:40 PM  Staffing Anesthesiologist: Elmer Picker, MD Performed: anesthesiologist   Preanesthetic Checklist Completed: patient identified, IV checked, risks and benefits discussed, monitors and equipment checked, pre-op evaluation and timeout performed  Epidural Patient position: sitting Prep: DuraPrep and site prepped and draped Patient monitoring: continuous pulse ox, blood pressure, heart rate and cardiac monitor Approach: midline Location: L3-L4 Injection technique: LOR air  Needle:  Needle type: Tuohy  Needle gauge: 17 G Needle length: 9 cm Needle insertion depth: 6 cm Catheter type: closed end flexible Catheter size: 19 Gauge Catheter at skin depth: 11 cm Test dose: negative  Assessment Sensory level: T8 Events: blood not aspirated, injection not painful, no injection resistance, no paresthesia and negative IV test  Additional Notes Patient identified. Risks/Benefits/Options discussed with patient including but not limited to bleeding, infection, nerve damage, paralysis, failed block, incomplete pain control, headache, blood pressure changes, nausea, vomiting, reactions to medication both or allergic, itching and postpartum back pain. Confirmed with bedside nurse the patient's most recent platelet count. Confirmed with patient that they are not currently taking any anticoagulation, have any bleeding history or any family history of bleeding disorders. Patient expressed understanding and wished to proceed. All questions were answered. Sterile technique was used throughout the entire procedure. Please see nursing notes for vital signs. Test dose was given through epidural catheter and negative prior to continuing to dose epidural or start infusion. Warning signs of high block given to the patient including shortness of breath, tingling/numbness in hands, complete motor block,  or any concerning symptoms with instructions to call for help. Patient was given instructions on fall risk and not to get out of bed. All questions and concerns addressed with instructions to call with any issues or inadequate analgesia.  Reason for block:procedure for pain

## 2022-03-26 NOTE — H&P (Addendum)
Elizabeth Becker is a 26 y.o. female presenting for labor evaluation and ruptured membranes in the MAU. OB History     Gravida  3   Para      Term      Preterm      AB  2   Living         SAB  0   IAB  2   Ectopic      Multiple      Live Births             Past Medical History:  Diagnosis Date   Anxiety    Depression    Genital herpes    Loss of consciousness (HCC)    Migraine    Vision abnormalities    wears glasses for distance   Past Surgical History:  Procedure Laterality Date   WISDOM TOOTH EXTRACTION     WOUND EXPLORATION Right 08/07/2020   Procedure: EXPLORATION RIGHT RING FINGER LACERATION;  Surgeon: Betha Loa, MD;  Location: [facility];  Service: Orthopedics;  Laterality: Right;   Family History: family history includes Healthy in her father and mother. Social History:  reports that she has never smoked. She has never used smokeless tobacco. She reports that she does not currently use drugs. She reports that she does not drink alcohol.     Maternal Diabetes: No Genetic Screening: Normal Maternal Ultrasounds/Referrals: Other:I do not have access to that report.  [facility] patient. Fetal Ultrasounds or other Referrals:  None Maternal Substance Abuse:  No Significant Maternal Medications:  Meds include: Other: prometrium and valtrex Significant Maternal Lab Results:  Group B Strep negative Number of Prenatal Visits:greater than 3 verified prenatal visits Other Comments:  None  Review of Systems No F/C/N/V/D  History Dilation: 4 Effacement (%): 60 Station: -3 Exam by:: Anastasio Champion, RN  FHT 140, mod variability, accels, no decels Toco irregular q6-48min  Blood pressure 122/81, pulse 94, temperature 98.2 F (36.8 C), temperature source Oral, resp. rate 16, last menstrual period 06/15/2021, SpO2 98 %. Exam Physical Exam  Please see CNM, Wynelle Bourgeois exam in MAU  Prenatal labs: ABO, Rh:  A+ Antibody:   Negative Rubella:  Immune RPR:   NR HBsAg:   neg HIV:   NR GBS:   Negative  Assessment/Plan: G4P0030 at 39 4/7wks in labor with SROM.  She has a h/o of HSV and has been taking valtrex daily with last outbreak per RN report 82yrs ago.  Speculum exam in MAU with report from RN of no lesions.  She had been taking prometrium due cervical shortening.  FHT cat 1.  Pain medication upon request.   [name] 03/26/2022, 1:56 AM

## 2022-03-26 NOTE — Progress Notes (Signed)
  Subjective: Pt with continued LOF> contractions rated as 7 out of 10. Pt denies an HSV outbreak.   Objective: BP 119/77   Pulse 88   Temp 98 F (36.7 C) (Oral)   Resp 14   LMP 06/15/2021   SpO2 98%  No intake/output data recorded. No intake/output data recorded.  FHT:  FHR: 130 bpm, variability: moderate,  accelerations:  Present,  decelerations:  Absent UC:   irregular, every 5-8 minutes SVE:   Dilation: 6 Effacement (%): 70, 80 Station: 0, -1 Exam by:: Fredia Beets, RN  Labs: Lab Results  Component Value Date   WBC 7.0 03/26/2022   HGB 12.4 03/26/2022   HCT 36.3 03/26/2022   MCV 96.3 03/26/2022   PLT 215 03/26/2022    Assessment / Plan: Protracted latent phase  Labor:  Progressing normally  Preeclampsia:   NA Fetal Wellbeing:  Category I Pain Control:  Labor support without medications I/D:  n/a Anticipated MOD:  NSVD  Gerald Leitz, MD 03/26/2022, 8:07 AM

## 2022-03-26 NOTE — Progress Notes (Signed)
Written birth plan bedside: -quiet environment; dimmed lights -few vaginal exams as possible -limited staff (NO STUDENTS, RESIDENTS, INTERNS, etc.) -mirror for birth -touch baby's head when crowning -wear own clothes -DO NOT offer pain meds unless pt requests -Prefers not to push on back; open to all other positions -delayed cord clamping until cord stops pulsating -YaYa to cut umbilical cord -deliver placents naturally -skin to skin immediately -CORD BLOOD NOT COLLECTED -baby may have all meds, but ask pt first before giving (delayed erythromycin up to one hour) -breast feed and pump breast milk

## 2022-03-26 NOTE — MAU Provider Note (Signed)
Chief Complaint:  Rupture of Membranes  Seen by provider at 0115    HPI: Elizabeth Becker is a 26 y.o. G3P0020 at 58w4dwho presents to maternity admissions reporting Leaking of fluid      Slow trickling and wetness on clothing  She reports good fetal movement, denies vaginal bleeding, vaginal itching/burning, urinary symptoms, h/a, dizziness, n/v, or fever/chills.    HPI  Past Medical History: Past Medical History:  Diagnosis Date   Anxiety    Depression    Genital herpes    Loss of consciousness (HCC)    Migraine    Vision abnormalities    wears glasses for distance    Past obstetric history: OB History  Gravida Para Term Preterm AB Living  3       2    SAB IAB Ectopic Multiple Live Births  0 2          # Outcome Date GA Lbr Len/2nd Weight Sex Delivery Anes PTL Lv  3 Current           2 IAB           1 IAB             Past Surgical History: Past Surgical History:  Procedure Laterality Date   WISDOM TOOTH EXTRACTION     WOUND EXPLORATION Right 08/07/2020   Procedure: EXPLORATION RIGHT RING FINGER LACERATION;  Surgeon: Betha Loa, MD;  Location: [facility];  Service: Orthopedics;  Laterality: Right;    Family History: Family History  Problem Relation Age of Onset   Healthy Mother    Healthy Father     Social History: Social History   Tobacco Use   Smoking status: Never   Smokeless tobacco: Never  Vaping Use   Vaping Use: Never used  Substance Use Topics   Alcohol use: No    Alcohol/week: 0.0 standard drinks of alcohol   Drug use: Not Currently    Comment: tried marijuana last use 1 year ago    Allergies: No Known Allergies  Meds:  Medications Prior to Admission  Medication Sig Dispense Refill Last Dose   Prenatal Vit-Fe Fumarate-FA (PRENATAL VITAMINS PO) Take by mouth.      progesterone (PROMETRIUM) 200 MG capsule Take 200 mg by mouth daily.      valACYclovir (VALTREX) 1000 MG tablet Take 1,000 mg by mouth 2 (two) times daily.        ROS:  Review of Systems  Respiratory:  Negative for shortness of breath.   Gastrointestinal:  Positive for abdominal pain.  Genitourinary:  Positive for vaginal discharge. Negative for dysuria and vaginal bleeding.     I have reviewed patient's Past Medical Hx, Surgical Hx, Family Hx, Social Hx, medications and allergies.   Physical Exam  Patient Vitals for the past 24 hrs:  BP Temp Temp src Pulse Resp SpO2  03/26/22 0052 122/81 98.2 F (36.8 C) Oral 94 16 98 %   Constitutional: Well-developed, well-nourished female in no acute distress.  Cardiovascular: normal rate and rhythm Respiratory: normal effort, no distress GI: Abd soft, non-tender, gravid appropriate for gestational age.  MS: Extremities nontender, no edema, normal ROM Neurologic: Alert and oriented x 4.  GU: Neg CVAT.  PELVIC EXAM: Cervix pink, visually closed, without lesion, small watery fluid with white creamy discharge, vaginal walls and external genitalia normal  Positive Ferning  FHT:  Baseline 140 , moderate variability, accelerations present, no decelerations Contractions: Irregular   Labs: Results for orders placed or  performed during the hospital encounter of 03/26/22 (from the past 24 hour(s))  Fern Test     Status: None   Collection Time: 03/26/22  1:01 AM  Result Value Ref Range   POCT Fern Test Negative = intact amniotic membranes   Fern Test     Status: None   Collection Time: 03/26/22  1:32 AM  Result Value Ref Range   POCT Fern Test Positive = ruptured amniotic membanes       Imaging:  No results found.  MAU Course/MDM: I have Done a speculum exam and fern test which was Positive  Pooling was positive  Nitrazine not done.    Pt stable at time of discharge.  Assessment: Single IUP at [redacted]w[redacted]d Premature Rupture of Membranes Latent Labor  Plan: Admit  CCOB to follow   Wynelle Bourgeois CNM, MSN Certified Nurse-Midwife 03/26/2022 1:46 AM

## 2022-03-26 NOTE — Progress Notes (Signed)
   Subjective: Patients rates contractions as 10 out of 10. Continued lof no vaginal bleeding   Objective: BP 126/61   Pulse 92   Temp 98 F (36.7 C) (Oral)   Resp 17   LMP 06/15/2021   SpO2 98%  No intake/output data recorded. No intake/output data recorded.  FHT:  FHR: 130 bpm, variability: moderate,  accelerations:  Present,  decelerations:  Present variable  UC:   regular, every 2-3 minutes SVE: 7/90/0  Labs: Lab Results  Component Value Date   WBC 7.0 03/26/2022   HGB 12.4 03/26/2022   HCT 36.3 03/26/2022   MCV 96.3 03/26/2022   PLT 215 03/26/2022    Assessment / Plan: Augmentation of labor, progressing well  Labor:  progressing on pitocin currently at 5 mu  will continue to increase as tolerated  Preeclampsia:   NA Fetal Wellbeing:  Category I Pain Control:  Nitrous Oxide I/D:  n/a Anticipated MOD:  NSVD  Gerald Leitz, MD 03/26/2022, 4:17 PM

## 2022-03-26 NOTE — MAU Note (Addendum)
Elizabeth Becker is a 26 y.o. at [redacted]w[redacted]d here in MAU reporting: rupture of membranes 03/25/2022 around 2300. Pt states the fluid was clear with no odor. Pt is not experiencing any ctx. No VB. +FM   Onset of complaint: 03/25/2022 Pain score: 0/10 Vitals:   03/26/22 0052  BP: 122/81  Pulse: 94  Resp: 16  Temp: 98.2 F (36.8 C)  SpO2: 98%     FHT:142 Lab orders placed from triage:  Crist Fat

## 2022-03-27 LAB — CBC
HCT: 31.5 % — ABNORMAL LOW (ref 36.0–46.0)
Hemoglobin: 10.8 g/dL — ABNORMAL LOW (ref 12.0–15.0)
MCH: 32.7 pg (ref 26.0–34.0)
MCHC: 34.3 g/dL (ref 30.0–36.0)
MCV: 95.5 fL (ref 80.0–100.0)
Platelets: 201 10*3/uL (ref 150–400)
RBC: 3.3 MIL/uL — ABNORMAL LOW (ref 3.87–5.11)
RDW: 13.9 % (ref 11.5–15.5)
WBC: 10.9 10*3/uL — ABNORMAL HIGH (ref 4.0–10.5)
nRBC: 0 % (ref 0.0–0.2)

## 2022-03-27 NOTE — Progress Notes (Signed)
Postpartum Note Day #1  S:  Patient doing well.  Pain controlled.  Tolerating regular diet.   Ambulating and voiding without difficulty.   Denies fevers, chills, chest pain, SOB, N/V, or worsening bilateral LE edema.  Lochia: Minimal Infant feeding:  Breast Circumcision:  N/A, female infant Contraception:  Will be discussed at 6 week PPV  O: Temp:  [97.8 F (36.6 C)-98.5 F (36.9 C)] 98.2 F (36.8 C) (08/17 0430) Pulse Rate:  [83-201] 83 (08/17 0430) Resp:  [16-19] 18 (08/17 0430) BP: (92-192)/(61-105) 108/71 (08/17 0430) SpO2:  [97 %-100 %] 100 % (08/17 0430) Gen: NAD, pleasant and cooperative Resp: No increased work of breathing Abdomen: soft, non-distended, non-tender throughout Uterus: firm, non-tender, below umbilicus Ext: No bilateral LE edema, no bilateral calf tenderness  Labs:  Recent Labs    03/26/22 0222 03/27/22 0348  HGB 12.4 10.8*  HCT 36.3 31.5*    A/P: Patient is a 26 y.o. [id number] PPD#1 s/p SVD.  - Pain well controlled  - GU: UOP is adequate - GI: Tolerating regular diet - Activity: encouraged sitting up to chair and ambulation as tolerated - DVT Prophylaxis: Frequent ambulation - Labs: stable as above  Disposition:  D/C home PPD#2.  Steva Ready, DO [phone] (office)

## 2022-03-27 NOTE — Lactation Note (Signed)
This note was copied from a baby's chart. Lactation Consultation Note  Patient Name: Girl Mariajose Mow WGNFA'O Date: 03/27/2022 Reason for consult: Initial assessment;1st time breastfeeding Age:25 hours  P1, Baby sleeping skin to skin on birth parent's chest. States baby is latching well.  Answered questions about milk storage. Feed on demand with cues.  Goal 8-12+ times per day after first 24 hrs.  Place baby STS if not cueing.  Mom made aware of O/P services, breastfeeding support groups, community resources, and our phone # for post-discharge questions.    Maternal Data Has patient been taught Hand Expression?: Yes Does the patient have breastfeeding experience prior to this delivery?: No  Feeding Mother's Current Feeding Choice: Breast Milk   Interventions Interventions: Education;LC Services brochure  Consult Status Consult Status: Follow-up Date: 03/28/22 Follow-up type: In-patient    Dahlia Byes [facility] 03/27/2022, 8:54 AM
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
## 2022-03-27 NOTE — Anesthesia Postprocedure Evaluation (Signed)
Anesthesia Post Note  Patient: Elizabeth Becker  Procedure(s) Performed: AN AD HOC LABOR EPIDURAL     Patient location during evaluation: Mother Baby Anesthesia Type: Epidural Level of consciousness: awake and alert Pain management: pain level controlled Vital Signs Assessment: post-procedure vital signs reviewed and stable Respiratory status: spontaneous breathing, nonlabored ventilation and respiratory function stable Cardiovascular status: stable Postop Assessment: no headache, no backache and epidural receding Anesthetic complications: no   No notable events documented.  Last Vitals:  Vitals:   03/27/22 0000 03/27/22 0430  BP: 117/76 108/71  Pulse: (!) 103 83  Resp: 18 18  Temp: 36.9 C 36.8 C  SpO2: 100% 100%    Last Pain:  Vitals:   03/27/22 0430  TempSrc: Oral  PainSc: 4    Pain Goal:                   Elizabeth Becker

## 2022-03-28 MED ORDER — IBUPROFEN 600 MG PO TABS: 600.0000 mg | ORAL_TABLET | Freq: Four times a day (QID) | ORAL | 1 refills | Status: DC | PRN

## 2022-03-28 NOTE — Plan of Care (Signed)
Discharge instructions given with after visit summary, pt receptive.  

## 2022-03-28 NOTE — Discharge Summary (Signed)
Postpartum Discharge Summary  Date of Service: 03/28/22     Patient Name: Elizabeth Becker DOB: 12-12-1995 MRN: [id number]  Date of admission: 03/26/2022 Delivery date:03/26/2022  Delivering provider: Waymon Amato  Date of discharge: 03/28/2022  Admitting diagnosis: Normal labor [O80, Z37.9] Intrauterine pregnancy: [redacted]w[redacted]d     Secondary diagnosis:  Principal Problem:   Normal labor  Additional problems: HSV and Cervical shortening  Discharge diagnosis: Term Pregnancy Delivered                                              Post partum procedures: None Augmentation: N/A Complications: None  Hospital course: Onset of Labor With Vaginal Delivery      26 y.o. yo [id number] at [redacted]w[redacted]d was admitted in Active Labor on 03/26/2022. Patient had an uncomplicated labor course as follows:  Membrane Rupture Time/Date: 11:00 PM ,03/25/2022   Delivery Method:Vaginal, Spontaneous  Episiotomy: None  Lacerations:  Periurethral  Patient had an uncomplicated postpartum course.  She is ambulating, tolerating a regular diet, passing flatus, and urinating well. Patient is discharged home in stable condition on 03/28/22.  Newborn Data: Birth date:03/26/2022  Birth time:8:49 PM  Gender:Female  Living status:Living  Apgars:9 ,9  Weight:3520 g   Magnesium Sulfate received: No BMZ received: No Rhophylac:N/A MMR:N/A T-DaP:Given prenatally Flu: N/A Transfusion:No  Physical exam  Vitals:   03/27/22 0840 03/27/22 1237 03/27/22 2110 03/28/22 0642  BP: 112/74 105/75 112/71 118/79  Pulse: 89 (!) 102 93 71  Resp: $Remo'17 16 18 18  'fHCtY$ Temp: 97.9 F (36.6 C) (!) 97.3 F (36.3 C) 99.1 F (37.3 C) 97.6 F (36.4 C)  TempSrc: Oral Oral Oral Oral  SpO2: 99% 99% 99% 99%   General: alert, cooperative, and no distress Lochia: appropriate Uterine Fundus: firm Incision: N/A DVT Evaluation: No evidence of DVT seen on physical exam. No cords or calf tenderness. Labs: Lab Results  Component Value Date   WBC 10.9 (H)  03/27/2022   HGB 10.8 (L) 03/27/2022   HCT 31.5 (L) 03/27/2022   MCV 95.5 03/27/2022   PLT 201 03/27/2022      Latest Ref Rng & Units 10/07/2020    1:45 AM  CMP  Glucose 70 - 99 mg/dL 54   BUN 6 - 20 mg/dL 19   Creatinine 0.44 - 1.00 mg/dL 0.88   Sodium 135 - 145 mmol/L 139   Potassium 3.5 - 5.1 mmol/L 3.4   Chloride 98 - 111 mmol/L 100   CO2 22 - 32 mmol/L 25   Calcium 8.9 - 10.3 mg/dL 9.7   Total Protein 6.5 - 8.1 g/dL 8.5   Total Bilirubin 0.3 - 1.2 mg/dL 1.1   Alkaline Phos 38 - 126 U/L 38   AST 15 - 41 U/L 17   ALT 0 - 44 U/L 11    Edinburgh Score:    03/27/2022   12:33 PM  Edinburgh Postnatal Depression Scale Screening Tool  I have been able to laugh and see the funny side of things. 0  I have looked forward with enjoyment to things. 0  I have blamed myself unnecessarily when things went wrong. 0  I have been anxious or worried for no good reason. 0  I have felt scared or panicky for no good reason. 0  Things have been getting on top of me. 0  I have been so unhappy that I have  had difficulty sleeping. 0  I have felt sad or miserable. 0  I have been so unhappy that I have been crying. 0  The thought of harming myself has occurred to me. 0  Edinburgh Postnatal Depression Scale Total 0      After visit meds:  Allergies as of 03/28/2022   No Known Allergies      Medication List     STOP taking these medications    progesterone 200 MG capsule Commonly known as: PROMETRIUM   valACYclovir 1000 MG tablet Commonly known as: VALTREX       TAKE these medications    ibuprofen 600 MG tablet Commonly known as: ADVIL Take 1 tablet (600 mg total) by mouth every 6 (six) hours as needed for mild pain, moderate pain or cramping.   PRENATAL VITAMINS PO Take by mouth.         Discharge home in stable condition Infant Feeding: Breast nand formula Infant Disposition:home with mother Discharge instruction: per After Visit Summary and Postpartum  booklet. Activity: Advance as tolerated. Pelvic rest for 6 weeks.  Diet: routine diet Anticipated Birth Control:  To be discussed at PPV Postpartum Appointment:6 weeks Additional Postpartum F/U:  None Future Appointments:No future appointments. Follow up Visit:  Follow-up Information     Christophe Louis, MD Follow up in 6 week(s).   Specialty: Obstetrics and Gynecology Why: Our office will call to arrange your 6 week postpartum visit. Contact information: [address] [place] [address] [phone]                     03/28/2022 Drema Dallas, DO

## 2022-03-29 ENCOUNTER — Inpatient Hospital Stay (HOSPITAL_COMMUNITY): Admit: 2022-03-29 | Payer: Self-pay

## 2022-04-05 ENCOUNTER — Telehealth (HOSPITAL_COMMUNITY): Payer: Self-pay | Admitting: *Deleted

## 2022-04-05 NOTE — Telephone Encounter (Signed)
Mom reports feeling good. No concerns about herself at this time. EPDS=0 [facility] score=0) Mom reports baby is doing well. Feeding, peeing, and pooping without difficulty. Safe sleep reviewed. Mom reports no concerns about baby at present.  Duffy Rhody, RN 04-05-2022 at 2:13pm

## 2022-06-10 DIAGNOSIS — Z30011 Encounter for initial prescription of contraceptive pills: Secondary | ICD-10-CM | POA: Diagnosis not present

## 2022-06-10 DIAGNOSIS — Z3202 Encounter for pregnancy test, result negative: Secondary | ICD-10-CM | POA: Diagnosis not present

## 2022-07-28 DIAGNOSIS — J101 Influenza due to other identified influenza virus with other respiratory manifestations: Secondary | ICD-10-CM | POA: Diagnosis not present

## 2022-07-28 DIAGNOSIS — R059 Cough, unspecified: Secondary | ICD-10-CM | POA: Diagnosis not present

## 2022-07-28 DIAGNOSIS — Z20822 Contact with and (suspected) exposure to covid-19: Secondary | ICD-10-CM | POA: Diagnosis not present

## 2023-01-06 DIAGNOSIS — L239 Allergic contact dermatitis, unspecified cause: Secondary | ICD-10-CM | POA: Diagnosis not present

## 2023-12-26 IMAGING — US US MFM OB LIMITED
1 series · 15 of 28 positions shown · non-contrast
Comparison: none

[Series 1: us mfm ob limited · 15 of 46 slices shown]
[im 1/46]
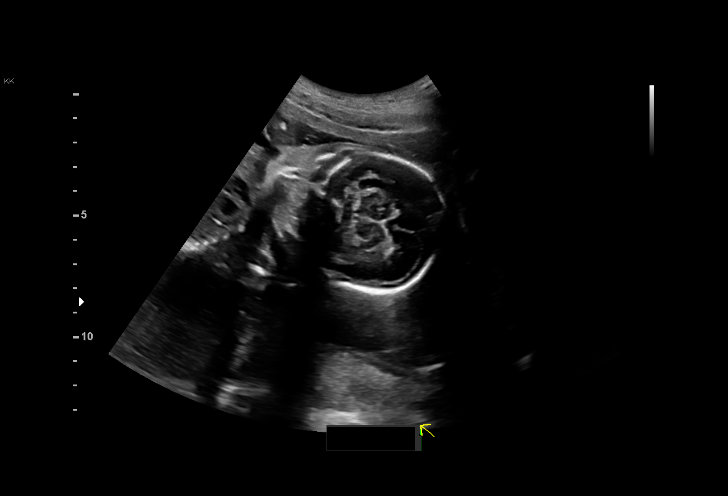
[im 4/46]
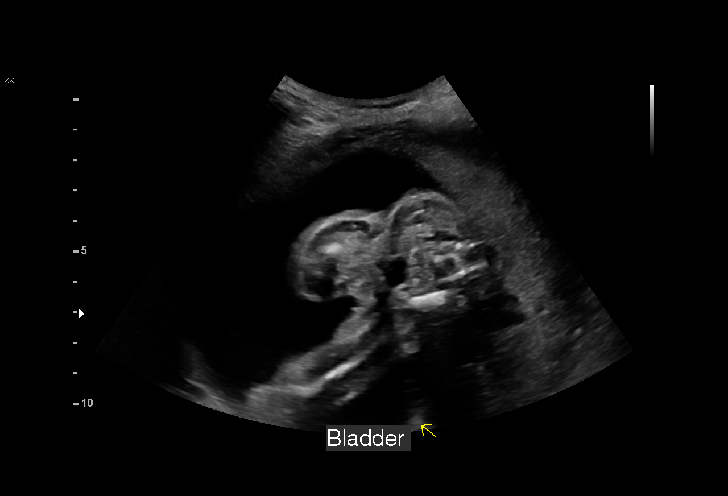
[im 7/46]
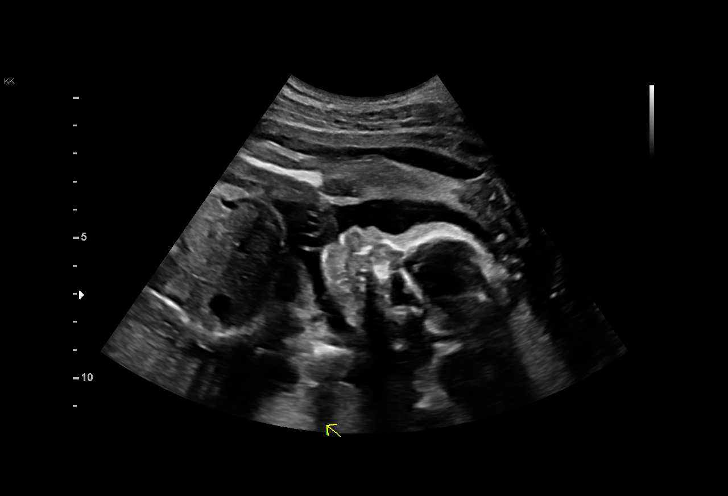
[im 11/46]
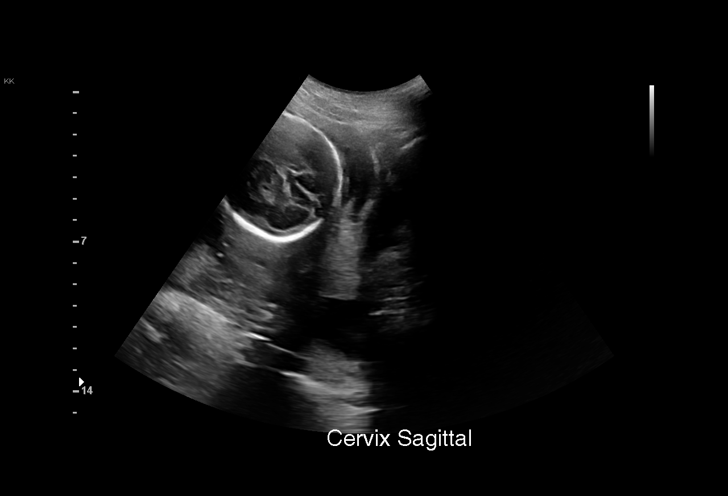
[im 14/46]
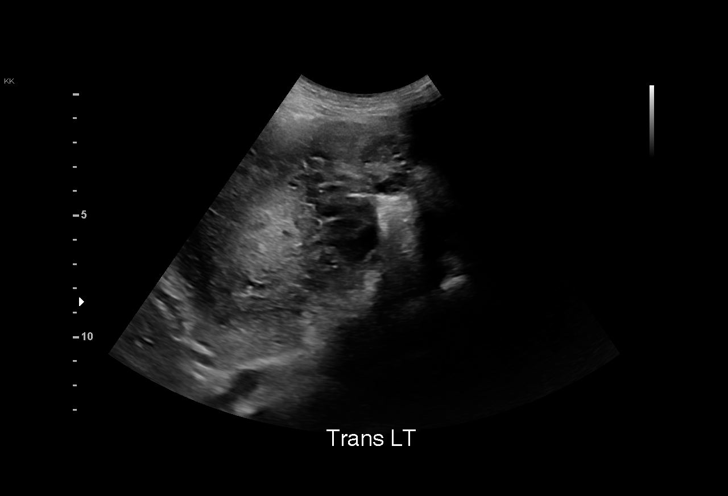
[im 17/46]
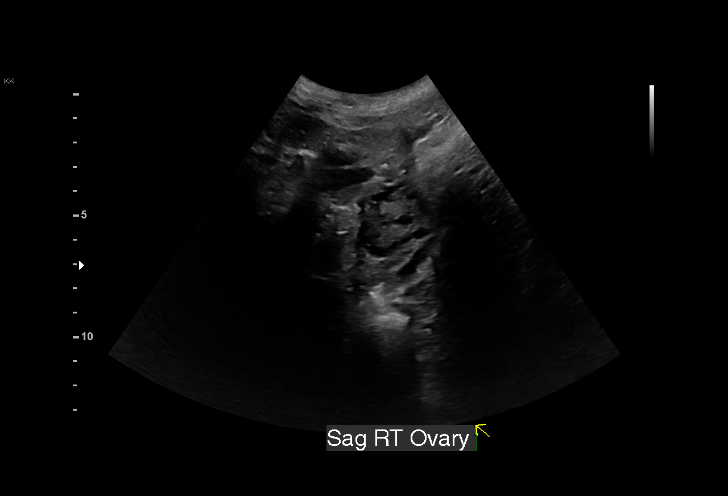
[im 21/46]
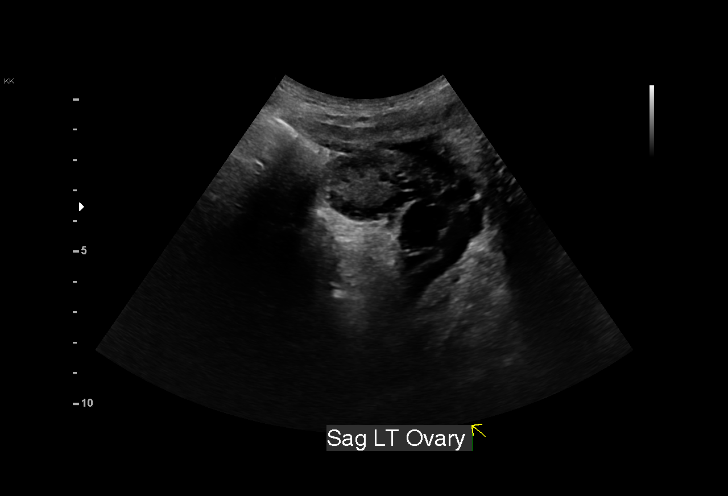
[im 24/46]
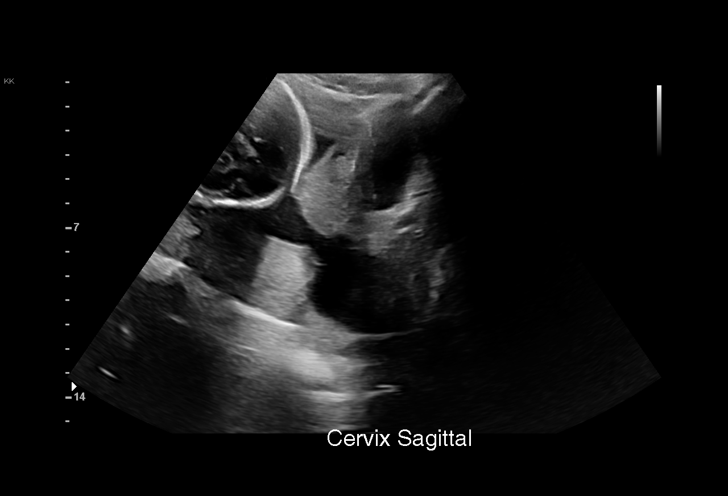
[im 26/46]
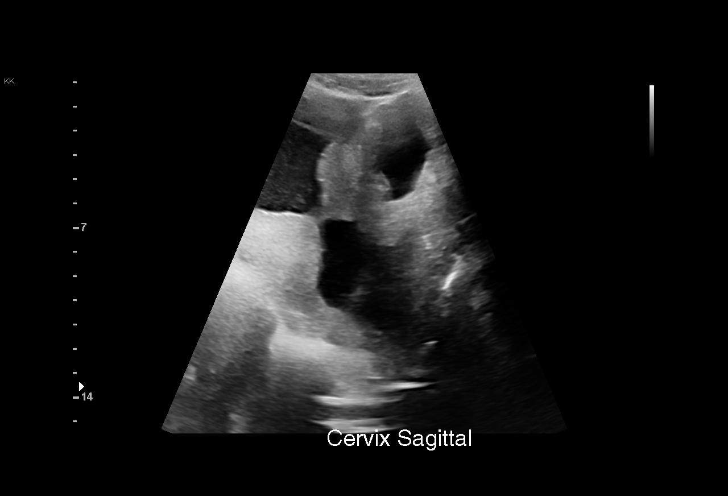
[im 29/46]
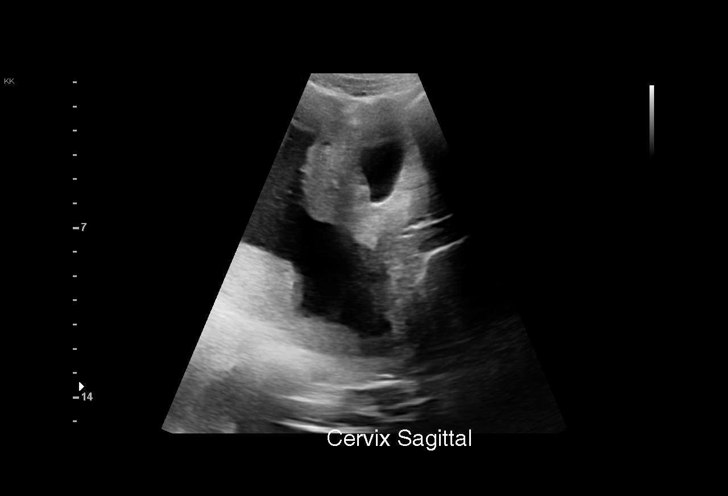
[im 32/46]
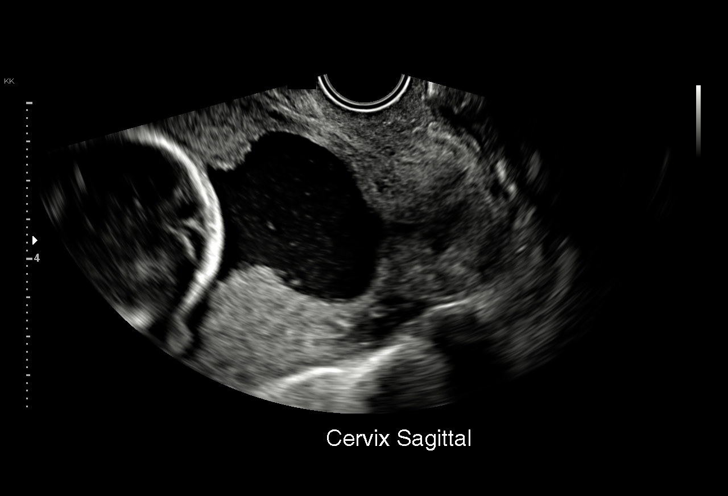
[im 36/46]
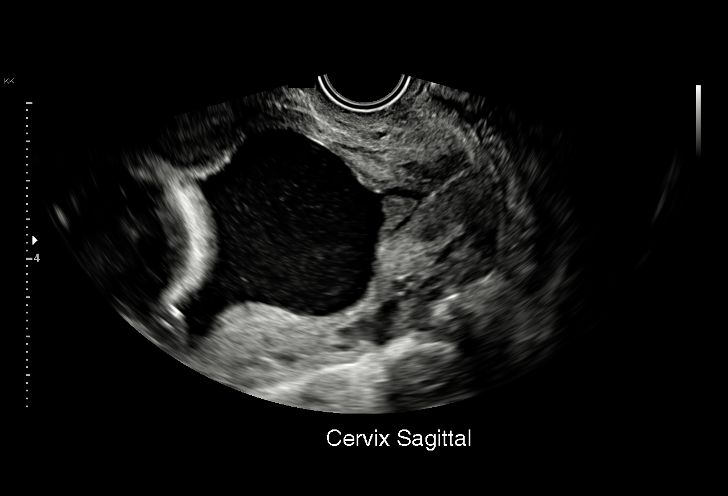
[im 39/46]
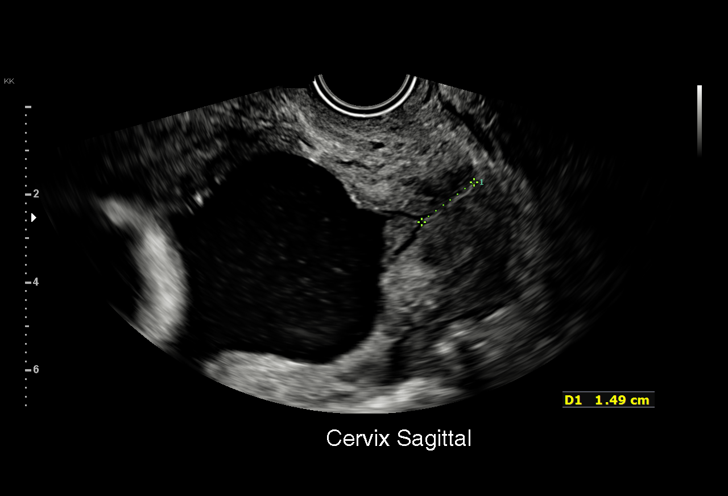
[im 42/46]
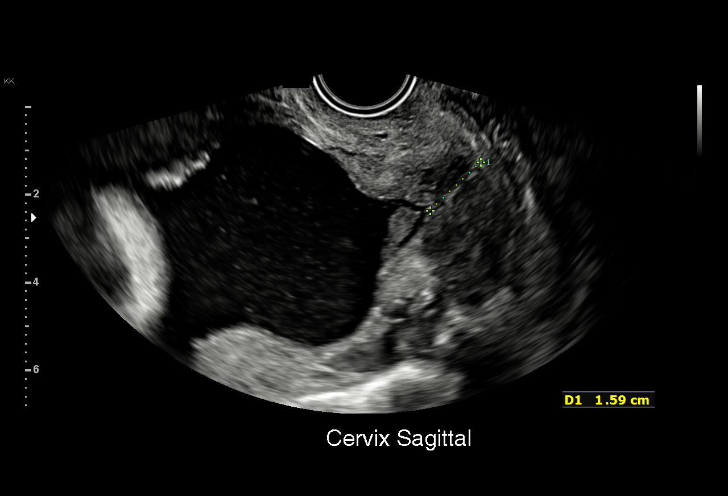
[im 46/46]
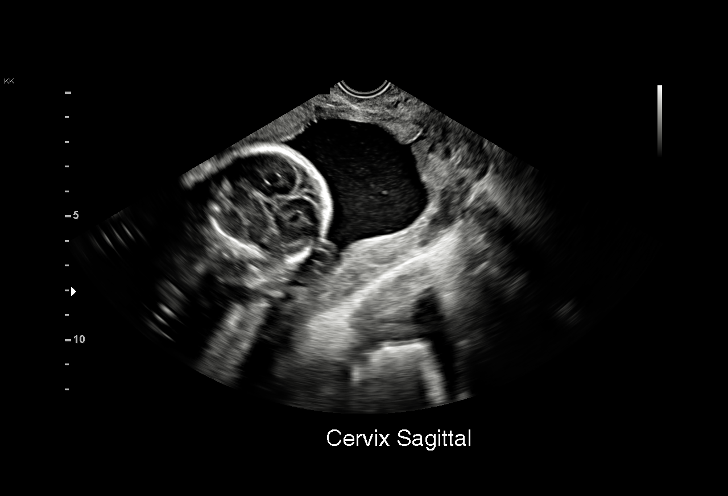

[15 of 28 positions shown; findings below may reference images not displayed]

1  US MFM OB LIMITED                     76815.01    YENI RIMMER
 2  US MFM OB TRANSVAGINAL                76817.2     YENI RIMMER

Indications

 Cervical shortening complicating pregnancy
 Encounter for cervical length
 22 weeks gestation of pregnancy
 Abdominal pain in pregnancy
Fetal Evaluation

 Num Of Fetuses:         1
 Fetal Heart Rate(bpm):  144
 Cardiac Activity:       Observed
 Presentation:           Cephalic
 Placenta:               Posterior
 P. Cord Insertion:      Not well visualized

 Amniotic Fluid
 AFI FV:      Within normal limits

                             Largest Pocket(cm)

OB History

 Gravidity:    4         Term:   0        Prem:   0        SAB:   1
 TOP:          2       Ectopic:  0        Living: 0
Gestational Age

 LMP:           23w 5d        Date:  06/15/21                 EDD:   03/22/22
 Best:          22w 5d     Det. By:  Previous Ultrasound      EDD:   03/29/22
                                     (11/05/21)
Anatomy
 Stomach:               Appears normal, left   Bladder:                Appears normal
                        sided
Cervix Uterus Adnexa

 Cervix
 Length:            1.5  cm.
 Measured transvaginally.

 Right Ovary
 Within normal limits.

 Left Ovary
 Within normal limits.

 Adnexa
 No abnormality visualized.
Impression

 Limited exam to evaluate pelvic pain.
 Good fetal movement and amniotic fluid volume.
 CL 1.5 cm with funneling and a small amout of intramniotic
 sludge.
Recommendations

 Consider initiating nightly vaginal progesterone if not
 previously initiated.
 have her attend this appointment.

## 2024-01-02 IMAGING — US US MFM OB TRANSVAGINAL
1 series · 14 of 28 positions shown · non-contrast
Comparison: none

[Series 1: us mfm ob transvaginal · 14 of 47 slices shown]
[im 2/47]
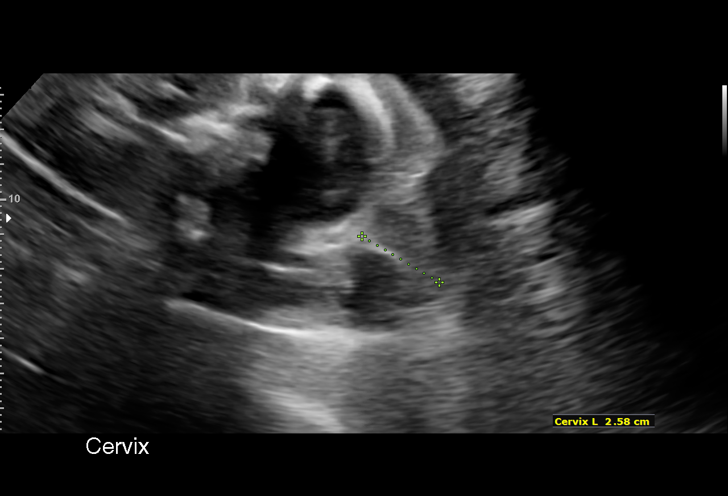
[im 6/47]
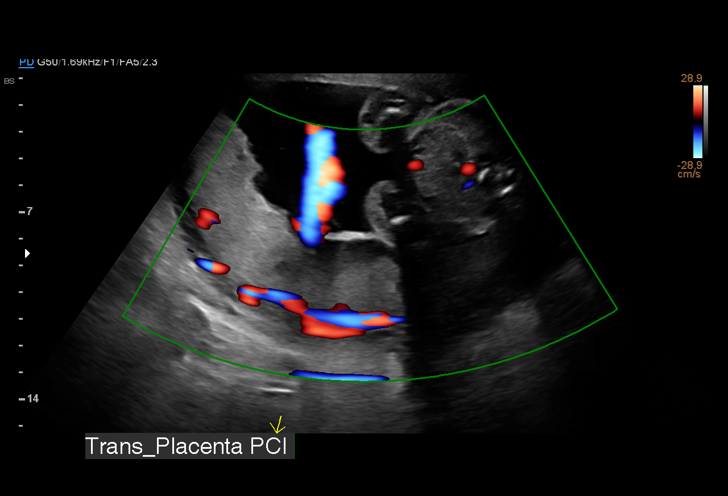
[im 9/47]
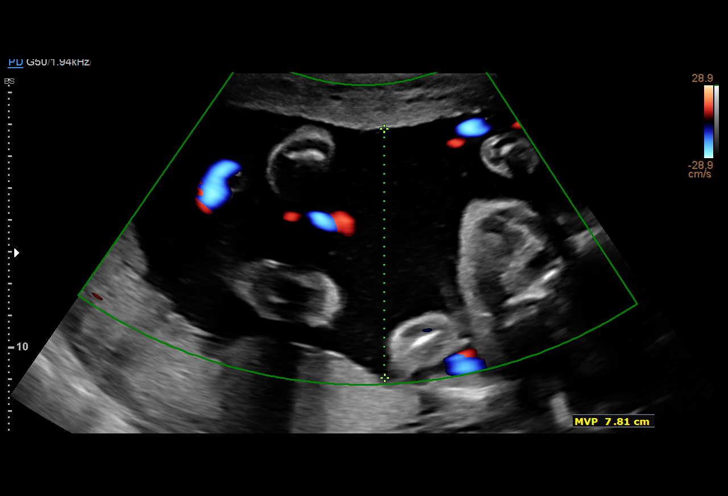
[im 12/47]
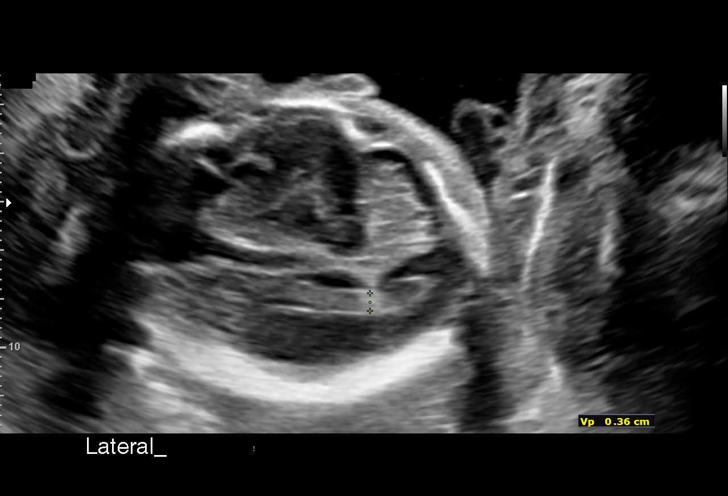
[im 16/47]
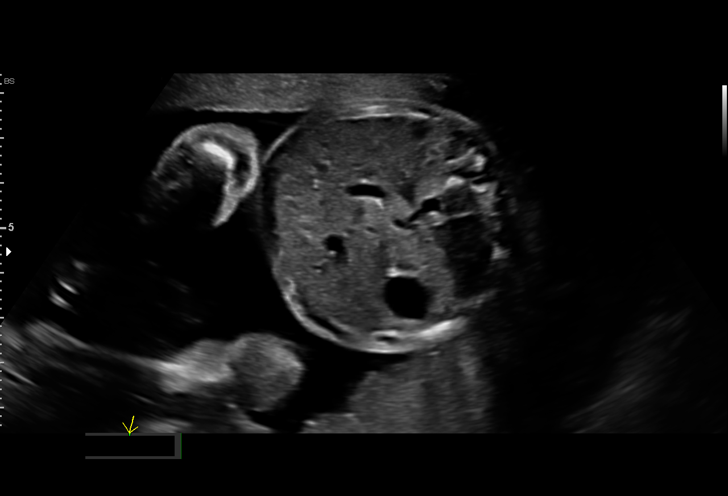
[im 19/47]
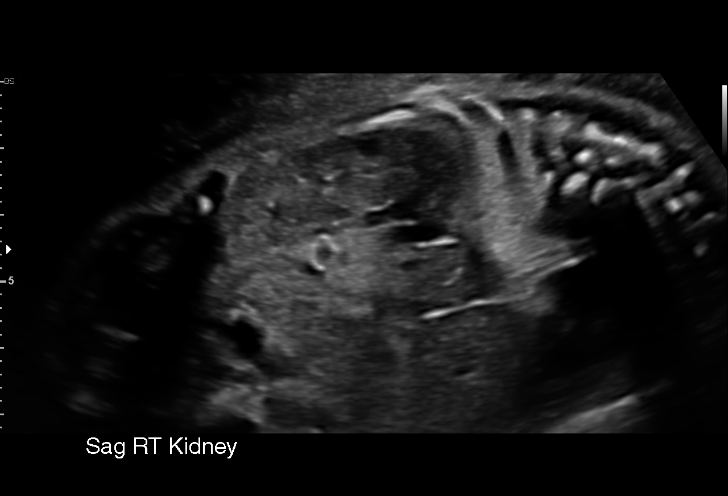
[im 23/47]
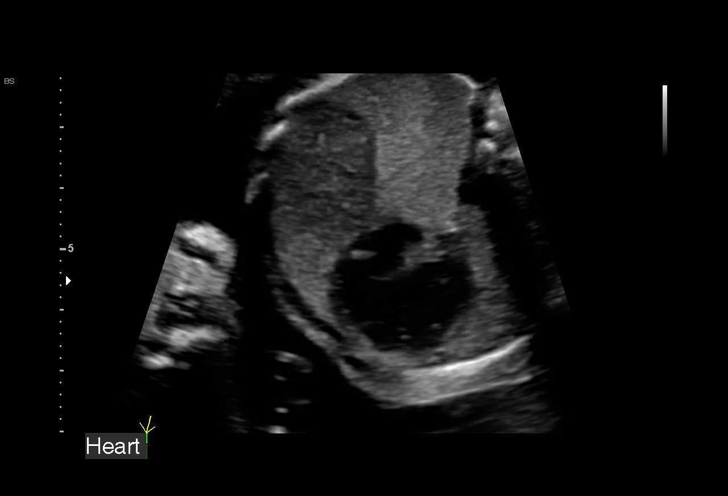
[im 26/47]
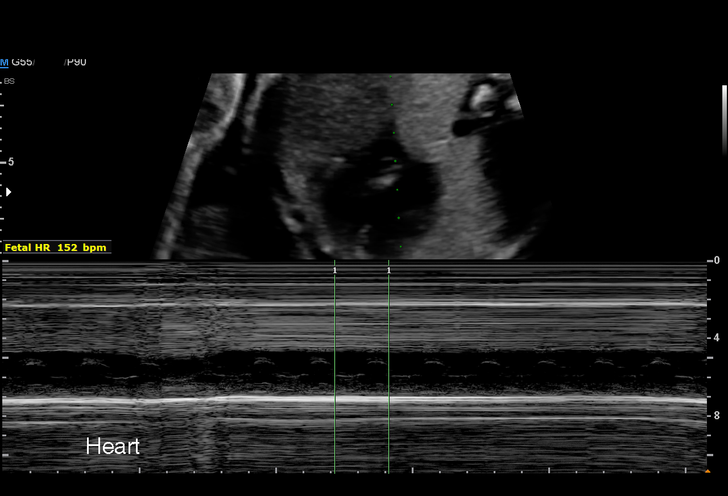
[im 29/47]
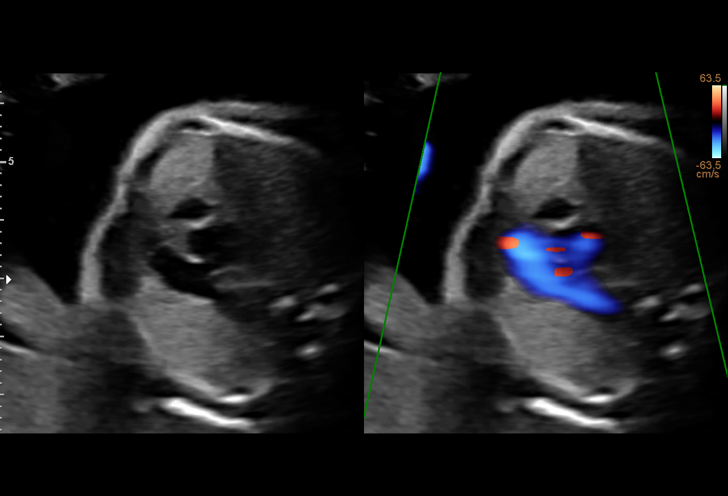
[im 33/47]
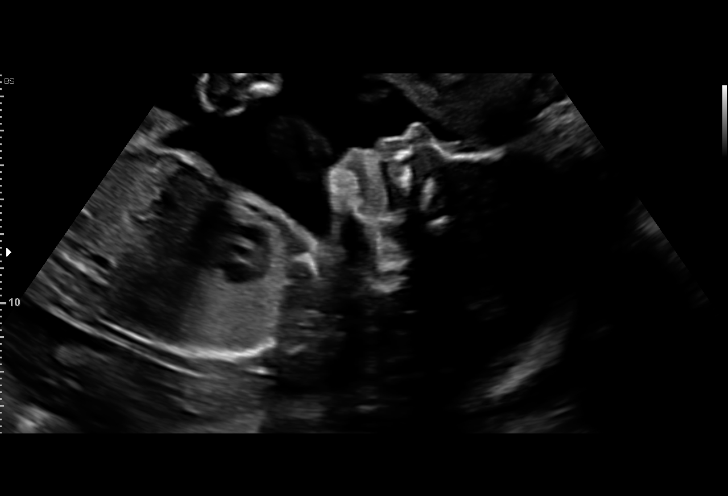
[im 36/47]
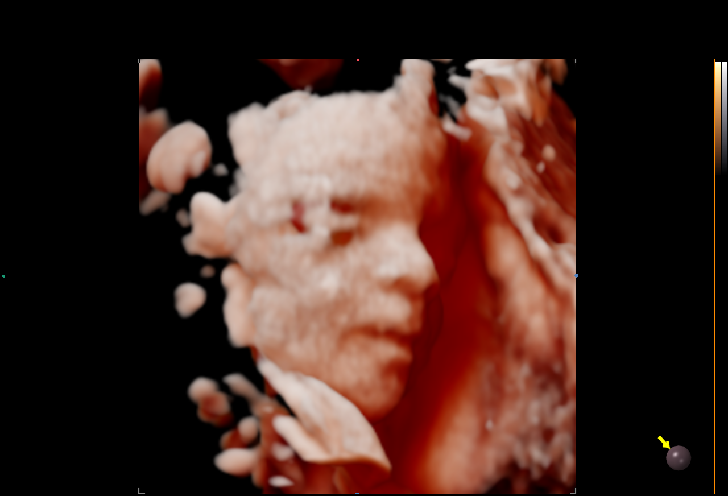
[im 40/47]
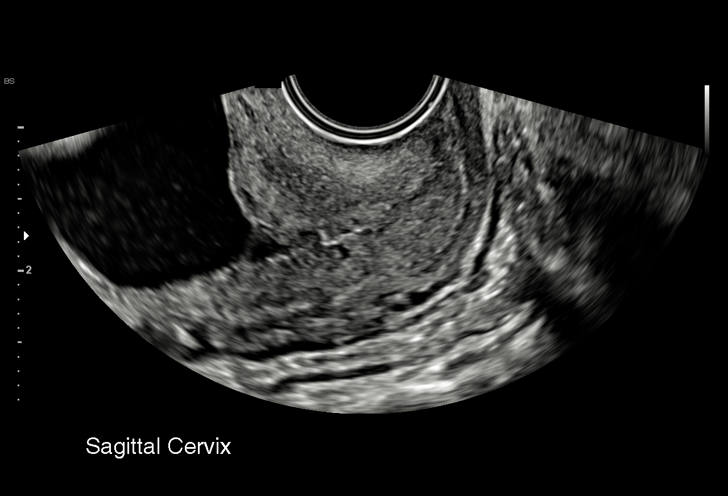
[im 43/47]
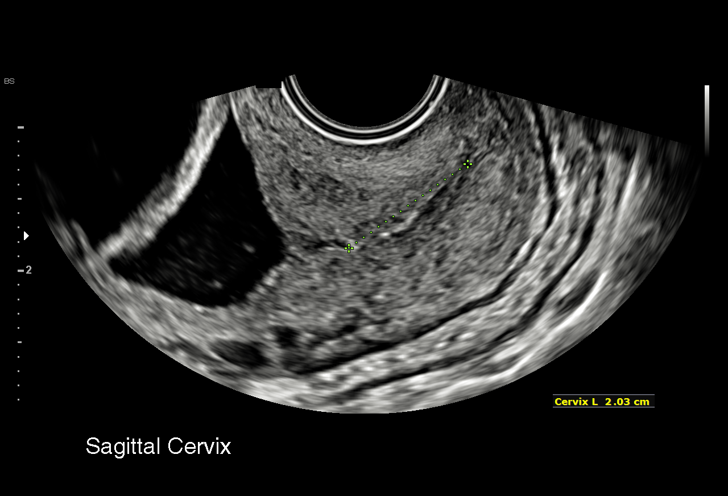
[im 47/47]
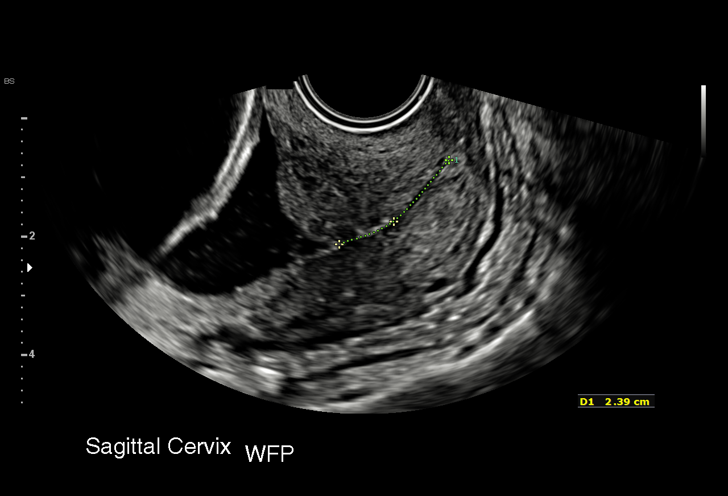

[14 of 28 positions shown; findings below may reference images not displayed]

1  US MFM OB TRANSVAGINAL                76817.2     CHAGI
                                                      MATZ
 2  US MFM OB LIMITED                     76815.01    CHAGI
                                                      MATZ

Indications

 Cervical shortening complicating pregnancy
 Encounter for cervical length
 Antenatal screening for malformations
 23 weeks gestation of pregnancy
Fetal Evaluation

 Num Of Fetuses:         1
 Fetal Heart Rate(bpm):  152
 Cardiac Activity:       Observed
 Presentation:           Cephalic
 Placenta:               Anterior
 P. Cord Insertion:      Visualized, central

 Amniotic Fluid
 AFI FV:      Within normal limits

                             Largest Pocket(cm)

OB History

 Gravidity:    4         Term:   0        Prem:   0        SAB:   1
 TOP:          2       Ectopic:  0        Living: 0
Gestational Age
 LMP:           24w 5d        Date:  06/15/21                 EDD:   03/22/22
 Best:          23w 5d     Det. By:  Previous Ultrasound      EDD:   03/29/22
                                     (11/05/21)
Anatomy

 Cranium:               Appears normal         Aortic Arch:            Previously seen
 Cavum:                 Previously seen        Ductal Arch:            Previously seen
 Ventricles:            Appears normal         Diaphragm:              Appears normal
 Choroid Plexus:        Previously seen        Stomach:                Appears normal, left
                                                                       sided
 Cerebellum:            Previously seen        Abdomen:                Appears normal
 Posterior Fossa:       Previously seen        Abdominal Wall:         Previously seen
 Nuchal Fold:           Previously seen        Cord Vessels:           Previously seen
 Face:                  Orbits and profile     Kidneys:                Appear normal
                        previously seen
 Lips:                  Previously seen        Bladder:                Appears normal
 Thoracic:              Previously seen        Spine:                  Previously seen
 Heart:                 Previously seen        Upper Extremities:      Previously seen
 RVOT:                  Appears normal         Lower Extremities:      Previously seen
 LVOT:                  Appears normal

 Other:  VC, 3VV and 3VTV prev visualized. Nasal bone, lenses, maxilla,
         mandible and falx prev visualized Heels/feet and open hands/5th
         digits prev visualized. Fetus prev appears to be female.
Cervix Uterus Adnexa

 Cervix
 Length:              2  cm.
 Measured transvaginally.

 Uterus
 No abnormality visualized.

 Right Ovary
 Within normal limits.

 Left Ovary
 Within normal limits.

 Cul De Sac
 No free fluid seen.

 Adnexa
 No adnexal mass visualized.
Impression

 Patient return for cervical length measurement.  Following
 diagnosis cervical insufficiency, she takes vaginal
 progesterone 200 mg daily.

 On today's ultrasound, amniotic fluid is normal and good fetal
 activity seen.  We performed a transvaginal ultrasound.  The
 cervix measures between 2 and 2.4 cm, which is within
 normal range.  No shortening or funneling was seen on
 transfundal pressure.

 I reassured the patient of the findings.
Recommendations

 Follow-up scans as clinically indicated.
 -Continue vaginal progesterone until 36 weeks gestation
                 Koeck, Moonen

## 2024-08-24 ENCOUNTER — Inpatient Hospital Stay (HOSPITAL_COMMUNITY)
Admission: AD | Admit: 2024-08-24 | Discharge: 2024-08-24 | Disposition: A | Discharge status: Home / Self Care | Attending: Obstetrics and Gynecology | Admitting: Obstetrics and Gynecology

## 2024-08-24 ENCOUNTER — Encounter (HOSPITAL_COMMUNITY): Payer: Self-pay | Admitting: Obstetrics and Gynecology

## 2024-08-24 DIAGNOSIS — S0081XA Abrasion of other part of head, initial encounter: Secondary | ICD-10-CM | POA: Diagnosis not present

## 2024-08-24 DIAGNOSIS — Z630 Problems in relationship with spouse or partner: Secondary | ICD-10-CM | POA: Insufficient documentation

## 2024-08-24 DIAGNOSIS — S70219A Abrasion, unspecified hip, initial encounter: Secondary | ICD-10-CM | POA: Diagnosis not present

## 2024-08-24 DIAGNOSIS — O368120 Decreased fetal movements, second trimester, not applicable or unspecified: Secondary | ICD-10-CM | POA: Insufficient documentation

## 2024-08-24 DIAGNOSIS — O9A212 Injury, poisoning and certain other consequences of external causes complicating pregnancy, second trimester: Secondary | ICD-10-CM | POA: Insufficient documentation

## 2024-08-24 DIAGNOSIS — Z3A26 26 weeks gestation of pregnancy: Secondary | ICD-10-CM | POA: Diagnosis not present

## 2024-08-24 DIAGNOSIS — Z653 Problems related to other legal circumstances: Secondary | ICD-10-CM | POA: Diagnosis not present

## 2024-08-24 DIAGNOSIS — O26892 Other specified pregnancy related conditions, second trimester: Secondary | ICD-10-CM

## 2024-08-24 LAB — URINALYSIS, ROUTINE W REFLEX MICROSCOPIC
Bacteria, UA: NONE SEEN
Bilirubin Urine: NEGATIVE
Glucose, UA: NEGATIVE mg/dL
Hgb urine dipstick: NEGATIVE
Ketones, ur: NEGATIVE mg/dL
Nitrite: NEGATIVE
Protein, ur: 30 mg/dL — AB
Specific Gravity, Urine: 1.011 (ref 1.005–1.030)
pH: 6 (ref 5.0–8.0)
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
## 2024-08-24 NOTE — MAU Note (Signed)
 SANE nurse informed that the pt no longer wanted to wait for evaluation and that the pt was discharged.
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
